# Patient Record
Sex: Female | Born: 1995 | Race: Black or African American | Hispanic: No | Marital: Single | Smoking: Never smoker
Health system: Southern US, Community
[De-identification: ages and names within clinical notes are randomized; demographics above are authoritative.]

## PROBLEM LIST (undated history)

## (undated) DIAGNOSIS — F419 Anxiety disorder, unspecified: Secondary | ICD-10-CM

## (undated) DIAGNOSIS — M1990 Unspecified osteoarthritis, unspecified site: Secondary | ICD-10-CM

## (undated) DIAGNOSIS — D649 Anemia, unspecified: Secondary | ICD-10-CM

## (undated) DIAGNOSIS — J45909 Unspecified asthma, uncomplicated: Secondary | ICD-10-CM

## (undated) DIAGNOSIS — F32A Depression, unspecified: Secondary | ICD-10-CM

## (undated) HISTORY — PX: ELBOW SURGERY: SHX618

## (undated) HISTORY — DX: Anxiety disorder, unspecified: F41.9

## (undated) HISTORY — DX: Unspecified osteoarthritis, unspecified site: M19.90

## (undated) HISTORY — DX: Depression, unspecified: F32.A

## (undated) HISTORY — DX: Anemia, unspecified: D64.9

## (undated) HISTORY — PX: SHOULDER SURGERY: SHX246

---
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
## 2014-04-09 ENCOUNTER — Encounter (HOSPITAL_COMMUNITY): Payer: Self-pay | Admitting: Nurse Practitioner

## 2014-04-09 ENCOUNTER — Emergency Department (HOSPITAL_COMMUNITY)
Admission: EM | Admit: 2014-04-09 | Discharge: 2014-04-10 | Disposition: A | Discharge status: Home / Self Care | Payer: Managed Care, Other (non HMO) | Attending: Emergency Medicine | Admitting: Emergency Medicine

## 2014-04-09 DIAGNOSIS — S0990XA Unspecified injury of head, initial encounter: Secondary | ICD-10-CM | POA: Diagnosis not present

## 2014-04-09 DIAGNOSIS — Y9389 Activity, other specified: Secondary | ICD-10-CM | POA: Insufficient documentation

## 2014-04-09 DIAGNOSIS — J45909 Unspecified asthma, uncomplicated: Secondary | ICD-10-CM | POA: Diagnosis not present

## 2014-04-09 DIAGNOSIS — Y92410 Unspecified street and highway as the place of occurrence of the external cause: Secondary | ICD-10-CM | POA: Diagnosis not present

## 2014-04-09 DIAGNOSIS — Z9889 Other specified postprocedural states: Secondary | ICD-10-CM | POA: Diagnosis not present

## 2014-04-09 DIAGNOSIS — S299XXA Unspecified injury of thorax, initial encounter: Secondary | ICD-10-CM | POA: Diagnosis not present

## 2014-04-09 DIAGNOSIS — S4992XA Unspecified injury of left shoulder and upper arm, initial encounter: Secondary | ICD-10-CM | POA: Diagnosis not present

## 2014-04-09 DIAGNOSIS — Y998 Other external cause status: Secondary | ICD-10-CM | POA: Insufficient documentation

## 2014-04-09 DIAGNOSIS — Z791 Long term (current) use of non-steroidal anti-inflammatories (NSAID): Secondary | ICD-10-CM | POA: Diagnosis not present

## 2014-04-09 HISTORY — DX: Unspecified asthma, uncomplicated: J45.909
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
## 2014-04-10 ENCOUNTER — Emergency Department (HOSPITAL_COMMUNITY): Payer: Managed Care, Other (non HMO)

## 2014-04-10 MED ORDER — NAPROXEN 500 MG PO TABS: 500.0000 mg | ORAL_TABLET | Freq: Two times a day (BID) | ORAL | Status: DC
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
## 2015-01-01 ENCOUNTER — Emergency Department (HOSPITAL_COMMUNITY)
Admission: EM | Admit: 2015-01-01 | Discharge: 2015-01-01 | Disposition: A | Discharge status: Home / Self Care | Payer: BLUE CROSS/BLUE SHIELD | Attending: Emergency Medicine | Admitting: Emergency Medicine

## 2015-01-01 ENCOUNTER — Encounter (HOSPITAL_COMMUNITY): Payer: Self-pay

## 2015-01-01 ENCOUNTER — Emergency Department (HOSPITAL_COMMUNITY): Payer: BLUE CROSS/BLUE SHIELD

## 2015-01-01 DIAGNOSIS — M25461 Effusion, right knee: Secondary | ICD-10-CM | POA: Insufficient documentation

## 2015-01-01 DIAGNOSIS — J45909 Unspecified asthma, uncomplicated: Secondary | ICD-10-CM | POA: Diagnosis not present

## 2015-01-01 DIAGNOSIS — M25561 Pain in right knee: Secondary | ICD-10-CM | POA: Diagnosis present

## 2015-01-01 DIAGNOSIS — Z791 Long term (current) use of non-steroidal anti-inflammatories (NSAID): Secondary | ICD-10-CM | POA: Insufficient documentation

## 2015-01-01 MED ORDER — OXYCODONE-ACETAMINOPHEN 5-325 MG PO TABS
2.0000 | ORAL_TABLET | Freq: Once | ORAL | Status: AC
  Administered 2015-01-01: 2 via ORAL
  Filled 2015-01-01: qty 2

## 2015-01-01 MED ORDER — HYDROCODONE-ACETAMINOPHEN 5-325 MG PO TABS: 1.0000 | ORAL_TABLET | ORAL | Status: DC | PRN

## 2015-01-01 MED ORDER — IBUPROFEN 800 MG PO TABS: 800.0000 mg | ORAL_TABLET | Freq: Three times a day (TID) | ORAL | Status: DC

## 2015-01-01 NOTE — Discharge Instructions (Signed)
Take Vicodin for severe pain only. No driving or operating heavy machinery while taking vicodin. This medication may cause drowsiness. Take ibuprofen as prescribed. Keep the knee immobilizer on unless you are non-weightbearing.  Knee Effusion The medical term for having fluid in your knee is effusion. This is often due to an internal derangement of the knee. This means something is wrong inside the knee. Some of the causes of fluid in the knee may be torn cartilage, a torn ligament, or bleeding into the joint from an injury. Your knee is likely more difficult to bend and move. This is often because there is increased pain and pressure in the joint. The time it takes for recovery from a knee effusion depends on different factors, including:   Type of injury.  Your age.  Physical and medical conditions.  Rehabilitation Strategies. How long you will be away from your normal activities will depend on what kind of knee problem you have and how much damage is present. Your knee has two types of cartilage. Articular cartilage covers the bone ends and lets your knee bend and move smoothly. Two menisci, thick pads of cartilage that form a rim inside the joint, help absorb shock and stabilize your knee. Ligaments bind the bones together and support your knee joint. Muscles move the joint, help support your knee, and take stress off the joint itself. CAUSES  Often an effusion in the knee is caused by an injury to one of the menisci. This is often a tear in the cartilage. Recovery after a meniscus injury depends on how much meniscus is damaged and whether you have damaged other knee tissue. Small tears may heal on their own with conservative treatment. Conservative means rest, limited weight bearing activity and muscle strengthening exercises. Your recovery may take up to 6 weeks.  TREATMENT  Larger tears may require surgery. Meniscus injuries may be treated during arthroscopy. Arthroscopy is a procedure in  which your surgeon uses a small telescope like instrument to look in your knee. Your caregiver can make a more accurate diagnosis (learning what is wrong) by performing an arthroscopic procedure. If your injury is on the inner margin of the meniscus, your surgeon may trim the meniscus back to a smooth rim. In other cases your surgeon will try to repair a damaged meniscus with stitches (sutures). This may make rehabilitation take longer, but may provide better long term result by helping your knee keep its shock absorption capabilities. Ligaments which are completely torn usually require surgery for repair. HOME CARE INSTRUCTIONS  Use crutches as instructed.  If a brace is applied, use as directed.  Once you are home, an ice pack applied to your swollen knee may help with discomfort and help decrease swelling.  Keep your knee raised (elevated) when you are not up and around or on crutches.  Only take over-the-counter or prescription medicines for pain, discomfort, or fever as directed by your caregiver.  Your caregivers will help with instructions for rehabilitation of your knee. This often includes strengthening exercises.  You may resume a normal diet and activities as directed. SEEK MEDICAL CARE IF:   There is increased swelling in your knee.  You notice redness, swelling, or increasing pain in your knee.  An unexplained oral temperature above 102 F (38.9 C) develops. SEEK IMMEDIATE MEDICAL CARE IF:   You develop a rash.  You have difficulty breathing.  You have any allergic reactions from medications you may have been given.  There is severe pain  with any motion of the knee. MAKE SURE YOU:   Understand these instructions.  Will watch your condition.  Will get help right away if you are not doing well or get worse. Document Released: 07/24/2003 Document Revised: 07/26/2011 Document Reviewed: 09/27/2007 [facility] Patient Information 2015 [place], [place]. This information  is not intended to replace advice given to you by your health care provider. Make sure you discuss any questions you have with your health care provider.  Patellar Dislocation A patellar dislocation occurs when your kneecap (patella) slips out of its normal position in a groove in front of the lower end of your thighbone (femur). This groove is called the patellofemoral groove.  CAUSES The kneecap is normally positioned over the front of the knee joint at the base of the thighbone. A kneecap can be dislocated when:  The kneecap is out of place (patellar tracking disorder), and force is applied.  The foot is firmly planted pointing outward, and the knee bends with the thigh turned inward. This kind of injury is common during many sports activities.  The inner edge of the kneecap is hit, pushing it toward the outer side of the leg. SIGNS AND SYMPTOMS  Severe pain.  A misshapen knee that looks like a bone is out of position.  A popping sensation, followed by a feeling that something is out of place.  Inability to bend or straighten the knee.  Knee swelling.  Cool, pale skin or numbness and tingling in or below the affected knee. DIAGNOSIS  Your health care provider will physically examine the injured area. An X-ray exam may be done to make sure a bone fracture has not occurred. In some cases, your health care provider may look inside your knee joint with an instrument much like a pencil-sized telescope (arthroscope). This may be done to make sure you have no loose cartilage in your joint. Loose cartilage is not visible on an X-ray image. TREATMENT  In many instances, the patella can be guided back into position without much difficulty. It often goes back into position by straightening the leg. Often, nothing more may be needed other than a brief period of immobilization followed by the exercises your health care provider recommends. If patellar dislocation starts to become frequent after the  first incident, surgery may be needed to prevent your patella from slipping out of place. HOME CARE INSTRUCTIONS   Only take over-the-counter or prescription medicines for pain, discomfort, or fever as directed by your health care provider.  Use a knee brace if directed to do so by your health care provider.  Use crutches as instructed.  Apply ice to the injured knee:  Put ice in a plastic bag.  Place a towel between your skin and the bag.  Leave the ice on for 20 minutes, 2-3 times a day.  Follow your health care provider's instructions for doing any recommended range-of-motion exercises or other exercises. SEEK IMMEDIATE MEDICAL CARE IF:  You have increased pain or swelling in the knee that is not relieved with medicine.  You have increasing inflammation in the knee.  You have locking or catching of your knee. MAKE SURE YOU:  Understand these instructions.  Will watch your condition.  Will get help right away if you are not doing well or get worse. Document Released: 01/26/2001 Document Revised: 02/21/2013 Document Reviewed: 12/13/2012 [facility] Patient Information 2015 [place], [place]. This information is not intended to replace advice given to you by your health care provider. Make sure you discuss  any questions you have with your health care provider. ° °
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
## 2015-01-30 HISTORY — PX: KNEE SURGERY: SHX244
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
## 2017-09-18 DIAGNOSIS — B977 Papillomavirus as the cause of diseases classified elsewhere: Secondary | ICD-10-CM | POA: Insufficient documentation

## 2019-08-01 ENCOUNTER — Ambulatory Visit: Payer: Self-pay | Admitting: Allergy and Immunology

## 2019-09-14 ENCOUNTER — Ambulatory Visit: Payer: Managed Care, Other (non HMO) | Attending: Internal Medicine

## 2019-09-14 DIAGNOSIS — Z23 Encounter for immunization: Secondary | ICD-10-CM

## 2019-09-14 NOTE — Progress Notes (Signed)
   Covid-19 Vaccination Clinic  Name:  Sandy Ryan    MRN: [id number] DOB: 11-02-1995  09/14/2019  Sandy Ryan was observed post Covid-19 immunization for 15 minutes without incident. She was provided with Vaccine Information Sheet and instruction to access the V-Safe system.   Sandy Ryan was instructed to call 911 with any severe reactions post vaccine: Marland Kitchen Difficulty breathing  . Swelling of face and throat  . A fast heartbeat  . A bad rash all over body  . Dizziness and weakness   Immunizations Administered    Name Date Dose VIS Date Route   Pfizer COVID-19 Vaccine 09/14/2019  4:17 PM 0.3 mL 07/11/2018 Intramuscular   Manufacturer: [employer], [employer]   Lot: [id number]   NDC: [address]-0

## 2019-10-08 ENCOUNTER — Ambulatory Visit: Payer: Managed Care, Other (non HMO) | Attending: Internal Medicine

## 2019-10-08 DIAGNOSIS — Z23 Encounter for immunization: Secondary | ICD-10-CM

## 2019-10-08 NOTE — Progress Notes (Signed)
   Covid-19 Vaccination Clinic  Name:  Sandy Ryan    MRN: [id number] DOB: 01/12/1996  10/08/2019  Sandy Ryan was observed post Covid-19 immunization for 15 minutes without incident. She was provided with Vaccine Information Sheet and instruction to access the V-Safe system.   Sandy Ryan was instructed to call 911 with any severe reactions post vaccine: Marland Kitchen Difficulty breathing  . Swelling of face and throat  . A fast heartbeat  . A bad rash all over body  . Dizziness and weakness   Immunizations Administered    Name Date Dose VIS Date Route   Pfizer COVID-19 Vaccine 10/08/2019  4:02 PM 0.3 mL 07/11/2018 Intramuscular   Manufacturer: [employer], [employer]   Lot: [id number]   NDC: [address]-1

## 2019-12-26 ENCOUNTER — Encounter (HOSPITAL_COMMUNITY): Payer: Self-pay | Admitting: Emergency Medicine

## 2019-12-26 ENCOUNTER — Emergency Department (HOSPITAL_COMMUNITY)
Admission: EM | Admit: 2019-12-26 | Discharge: 2019-12-26 | Disposition: A | Discharge status: Home / Self Care | Payer: Managed Care, Other (non HMO) | Attending: Emergency Medicine | Admitting: Emergency Medicine

## 2019-12-26 DIAGNOSIS — R040 Epistaxis: Secondary | ICD-10-CM | POA: Insufficient documentation

## 2019-12-26 DIAGNOSIS — Z5321 Procedure and treatment not carried out due to patient leaving prior to being seen by health care provider: Secondary | ICD-10-CM | POA: Insufficient documentation

## 2019-12-26 DIAGNOSIS — R05 Cough: Secondary | ICD-10-CM | POA: Diagnosis not present

## 2019-12-26 NOTE — ED Triage Notes (Signed)
Pt reports that she had negative covid test yesterday. Reports today had cough and took Dayquil for it thre it up then her nose started bleeding. Bleeding stopped at this time.

## 2020-01-03 ENCOUNTER — Ambulatory Visit: Payer: Managed Care, Other (non HMO) | Admitting: Nurse Practitioner

## 2020-01-14 ENCOUNTER — Other Ambulatory Visit: Payer: Self-pay

## 2020-01-14 ENCOUNTER — Encounter: Payer: Self-pay | Admitting: Nurse Practitioner

## 2020-01-14 ENCOUNTER — Ambulatory Visit (INDEPENDENT_AMBULATORY_CARE_PROVIDER_SITE_OTHER): Payer: Managed Care, Other (non HMO) | Admitting: Nurse Practitioner

## 2020-01-14 VITALS — BP 118/78 | HR 82 | Temp 97.4°F | Resp 18 | Ht 67.0 in | Wt 243.8 lb

## 2020-01-14 DIAGNOSIS — Z13220 Encounter for screening for lipoid disorders: Secondary | ICD-10-CM

## 2020-01-14 DIAGNOSIS — R87612 Low grade squamous intraepithelial lesion on cytologic smear of cervix (LGSIL): Secondary | ICD-10-CM

## 2020-01-14 DIAGNOSIS — Z136 Encounter for screening for cardiovascular disorders: Secondary | ICD-10-CM

## 2020-01-14 DIAGNOSIS — Z124 Encounter for screening for malignant neoplasm of cervix: Secondary | ICD-10-CM

## 2020-01-14 DIAGNOSIS — D500 Iron deficiency anemia secondary to blood loss (chronic): Secondary | ICD-10-CM | POA: Diagnosis not present

## 2020-01-14 DIAGNOSIS — Z0000 Encounter for general adult medical examination without abnormal findings: Secondary | ICD-10-CM

## 2020-01-14 DIAGNOSIS — M222X9 Patellofemoral disorders, unspecified knee: Secondary | ICD-10-CM | POA: Insufficient documentation

## 2020-01-14 NOTE — Assessment & Plan Note (Signed)
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 Repeat pap 08/23/2011 returned LGSIL, HPV effects present. Referred to gyn and seen by Dr. Vilma Prader 09/07/2011 with colposcopy reported as "no lesions seen to bx"  Entered referral to GYN today

## 2020-01-14 NOTE — Progress Notes (Signed)
Subjective:    Patient ID: Sandy Ryan, female    DOB: July 04, 1995, 24 y.o.   MRN: [id number]  Patient presents today for CPE and establish care (new patient)  HPI  Sexual History (orientation,birth control, marital status, STD):Bisexual, not sexually active at this time, last PAP 2019, will prefer pelvic and breast exam done by GYN, request for GYN referral, has regular menstrual cycles, denies need for STD screen  Depression/Suicide: Depression screen [facility] 2/9 01/14/2020  Decreased Interest 0  Down, Depressed, Hopeless 0  PHQ - 2 Score 0   Vision:up to date, use of corrective lens  Dental:up to date  Immunizations: (TDAP, Hep C screen, Pneumovax, Influenza, zoster)  Health Maintenance  Topic Date Due   Pap Smear  Never done   Pap Smear  Never done   Flu Shot  12/16/2019   Tetanus Vaccine  03/13/2028   COVID-19 Vaccine  Completed    Hepatitis C: One time screening is recommended by Center for Disease Control  (CDC) for  adults born from [birth year] through 1965.   Completed   HIV Screening  Completed   Diet:heart health Exercise: daily with trainer.  Weight:  Wt Readings from Last 3 Encounters:  01/14/20 243 lb 12.8 oz (110.6 kg)  01/01/15 162 lb (73.5 kg) (88 %, Z= 1.18)*  04/09/14 159 lb (72.1 kg) (88 %, Z= 1.15)*   * Growth percentiles are based on CDC (Girls, 2-20 Years) data.   Fall Risk: Fall Risk  01/14/2020  Falls in the past year? 0  Number falls in past yr: 0  Injury with Fall? 0  Risk for fall due to : No Fall Risks   Medications and allergies reviewed with patient and updated if appropriate.  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 (Patient taking differently: Take 800 mg by mouth every 6 (six) hours as needed. ) 21 tablet 0   No current facility-administered medications on file prior to visit.    Past Medical History:  Diagnosis Date   Asthma     Past Surgical History:  Procedure Laterality Date   ELBOW SURGERY     KNEE SURGERY Right 01/30/2015   torn meniscus and ACL   SHOULDER SURGERY     thumb surgery Left 2010    Social History   Socioeconomic History   Marital status: Single    Spouse name: Not on file   Number of children: 0   Years of education: 14   Highest education level: Associate degree: academic program  Occupational History   Not on file  Tobacco Use   Smoking status: Never Smoker   Smokeless tobacco: Never Used  Building services engineer Use: Never used  Substance and Sexual Activity   Alcohol use: Yes    Comment: socially   Drug use: No   Sexual activity: Not Currently    Birth control/protection: Abstinence    Comment: Bisexual  Other Topics Concern   Not on file  Social History Narrative   Not on file   Social Determinants of Health   Financial Resource Strain:  Difficulty of Paying Living Expenses: Not on file  Food Insecurity:    Worried About Running Out of Food in the Last Year: Not on file   Ran Out of Food in the Last Year: Not on file  Transportation Needs:    Lack of Transportation (Medical): Not on file   Lack of Transportation (Non-Medical): Not on file  Physical Activity:    Days of Exercise per Week: Not on file   Minutes of Exercise per Session: Not on file  Stress:    Feeling of Stress : Not on file  Social Connections:    Frequency of Communication with Friends and Family: Not on file   Frequency of Social Gatherings with Friends and Family: Not on file   Attends Religious Services: Not on file   Active Member of Clubs or  Organizations: Not on file   Attends Banker Meetings: Not on file   Marital Status: Not on file    Family History  Problem Relation Age of Onset   Hypertension Mother         Review of Systems  Constitutional: Negative for fever, malaise/fatigue and weight loss.  HENT: Negative for congestion and sore throat.   Eyes:       Negative for visual changes  Respiratory: Negative for cough and shortness of breath.   Cardiovascular: Negative for chest pain, palpitations and leg swelling.  Gastrointestinal: Negative for blood in stool, constipation, diarrhea and heartburn.  Genitourinary: Negative for dysuria, frequency and urgency.  Musculoskeletal: Negative for falls, joint pain and myalgias.  Skin: Negative for rash.  Neurological: Negative for dizziness, sensory change and headaches.  Endo/Heme/Allergies: Does not bruise/bleed easily.  Psychiatric/Behavioral: Negative for depression, substance abuse and suicidal ideas. The patient is not nervous/anxious.    Objective:   Vitals:   01/14/20 1309  BP: 118/78  Pulse: 82  Resp: 18  Temp: (!) 97.4 F (36.3 C)  SpO2: 99%   Body mass index is 38.18 kg/m.  Physical Examination:  Physical Exam Vitals reviewed.  Constitutional:      General: She is not in acute distress.    Appearance: She is well-developed. She is obese.  HENT:     Right Ear: Tympanic membrane, ear canal and external ear normal.     Left Ear: Tympanic membrane, ear canal and external ear normal.  Eyes:     Extraocular Movements: Extraocular movements intact.     Conjunctiva/sclera: Conjunctivae normal.  Cardiovascular:     Rate and Rhythm: Normal rate and regular rhythm.     Pulses: Normal pulses.     Heart sounds: Normal heart sounds.  Pulmonary:     Effort: Pulmonary effort is normal. No respiratory distress.     Breath sounds: Normal breath sounds.  Chest:     Chest wall: No tenderness.  Abdominal:     General: Bowel sounds are  normal.     Palpations: Abdomen is soft.  Genitourinary:    Comments: Deferred breast and pelvic exam to GYN per patient Musculoskeletal:        General: Normal range of motion.     Cervical back: Normal range of motion and neck supple.  Lymphadenopathy:     Cervical: No cervical adenopathy.  Skin:    General: Skin is warm and dry.  Neurological:     Mental Status: She is alert and oriented to person, place, and time.     Deep Tendon Reflexes: Reflexes are normal and symmetric.  Psychiatric:  Mood and Affect: Mood normal.        Behavior: Behavior normal.        Thought Content: Thought content normal.    ASSESSMENT and PLAN: This visit occurred during the SARS-CoV-2 public health emergency.  Safety protocols were in place, including screening questions prior to the visit, additional usage of staff PPE, and extensive cleaning of exam room while observing appropriate contact time as indicated for disinfecting solutions.   Drew was seen today for annual exam.  Diagnoses and all orders for this visit:  Preventative health care -     CBC with Differential/Platelet; Future -     Comprehensive metabolic panel; Future -     Lipid panel; Future -     TSH; Future  Pap smear for cervical cancer screening -     Ambulatory referral to Gynecology  Encounter for lipid screening for cardiovascular disease -     Lipid panel; Future  Iron deficiency anemia due to chronic blood loss -     Iron and TIBC; Future  Low grade squamous intraepithelial lesion on cytologic smear of cervix (LGSIL) -     Ambulatory referral to Gynecology        Problem List Items Addressed This Visit      Other   Abnormal Pap smear of cervix    Per record from previous pcp with [facility]: "Pap smear here baseline 07/30/2011 returned LGSIL, mild dysplasia, HPV positive. Repeat pap 08/23/2011 returned LGSIL, HPV effects present. Referred to gyn and seen by Dr. Vilma Prader 09/07/2011 with colposcopy reported  as "no lesions seen to bx"  Entered referral to GYN today       Relevant Orders   Ambulatory referral to Gynecology   Iron deficiency anemia due to chronic blood loss   Relevant Orders   Iron and TIBC    Other Visit Diagnoses    Preventative health care    -  Primary   Relevant Orders   CBC with Differential/Platelet   Comprehensive metabolic panel   Lipid panel   TSH   Pap smear for cervical cancer screening       Relevant Orders   Ambulatory referral to Gynecology   Encounter for lipid screening for cardiovascular disease       Relevant Orders   Lipid panel      Follow up: Return if symptoms worsen or fail to improve.  Alysia Penna, NP

## 2020-01-14 NOTE — Patient Instructions (Signed)
You will be contacted to schedule an appt with GYN  Schedule lab appt (need to be fasting at least 6-8hrs prior to blood draw)  Thank you for choosing [facility] for you health needs   Health Maintenance, Female Adopting a healthy lifestyle and getting preventive care are important in promoting health and wellness. Ask your health care provider about:  The right schedule for you to have regular tests and exams.  Things you can do on your own to prevent diseases and keep yourself healthy. What should I know about diet, weight, and exercise? Eat a healthy diet   Eat a diet that includes plenty of vegetables, fruits, low-fat dairy products, and lean protein.  Do not eat a lot of foods that are high in solid fats, added sugars, or sodium. Maintain a healthy weight Body mass index (BMI) is used to identify weight problems. It estimates body fat based on height and weight. Your health care provider can help determine your BMI and help you achieve or maintain a healthy weight. Get regular exercise Get regular exercise. This is one of the most important things you can do for your health. Most adults should:  Exercise for at least 150 minutes each week. The exercise should increase your heart rate and make you sweat (moderate-intensity exercise).  Do strengthening exercises at least twice a week. This is in addition to the moderate-intensity exercise.  Spend less time sitting. Even light physical activity can be beneficial. Watch cholesterol and blood lipids Have your blood tested for lipids and cholesterol at 25 years of age, then have this test every 5 years. Have your cholesterol levels checked more often if:  Your lipid or cholesterol levels are high.  You are older than 25 years of age.  You are at high risk for heart disease. What should I know about cancer screening? Depending on your health history and family history, you may need to have cancer screening at various  ages. This may include screening for:  Breast cancer.  Cervical cancer.  Colorectal cancer.  Skin cancer.  Lung cancer. What should I know about heart disease, diabetes, and high blood pressure? Blood pressure and heart disease  High blood pressure causes heart disease and increases the risk of stroke. This is more likely to develop in people who have high blood pressure readings, are of African descent, or are overweight.  Have your blood pressure checked: ? Every 3-5 years if you are 37-4 years of age. ? Every year if you are 94 years old or older. Diabetes Have regular diabetes screenings. This checks your fasting blood sugar level. Have the screening done:  Once every three years after age 57 if you are at a normal weight and have a low risk for diabetes.  More often and at a younger age if you are overweight or have a high risk for diabetes. What should I know about preventing infection? Hepatitis B If you have a higher risk for hepatitis B, you should be screened for this virus. Talk with your health care provider to find out if you are at risk for hepatitis B infection. Hepatitis C Testing is recommended for:  Everyone born from [birth year] through 1966.  Anyone with known risk factors for hepatitis C. Sexually transmitted infections (STIs)  Get screened for STIs, including gonorrhea and chlamydia, if: ? You are sexually active and are younger than 25 years of age. ? You are older than 25 years of age and your health care provider  tells you that you are at risk for this type of infection. ? Your sexual activity has changed since you were last screened, and you are at increased risk for chlamydia or gonorrhea. Ask your health care provider if you are at risk.  Ask your health care provider about whether you are at high risk for HIV. Your health care provider may recommend a prescription medicine to help prevent HIV infection. If you choose to take medicine to prevent HIV, you  should first get tested for HIV. You should then be tested every 3 months for as long as you are taking the medicine. Pregnancy  If you are about to stop having your period (premenopausal) and you may become pregnant, seek counseling before you get pregnant.  Take 400 to 800 micrograms (mcg) of folic acid every day if you become pregnant.  Ask for birth control (contraception) if you want to prevent pregnancy. Osteoporosis and menopause Osteoporosis is a disease in which the bones lose minerals and strength with aging. This can result in bone fractures. If you are 41 years old or older, or if you are at risk for osteoporosis and fractures, ask your health care provider if you should:  Be screened for bone loss.  Take a calcium or vitamin D supplement to lower your risk of fractures.  Be given hormone replacement therapy (HRT) to treat symptoms of menopause. Follow these instructions at home: Lifestyle  Do not use any products that contain nicotine or tobacco, such as cigarettes, e-cigarettes, and chewing tobacco. If you need help quitting, ask your health care provider.  Do not use street drugs.  Do not share needles.  Ask your health care provider for help if you need support or information about quitting drugs. Alcohol use  Do not drink alcohol if: ? Your health care provider tells you not to drink. ? You are pregnant, may be pregnant, or are planning to become pregnant.  If you drink alcohol: ? Limit how much you use to 0-1 drink a day. ? Limit intake if you are breastfeeding.  Be aware of how much alcohol is in your drink. In the [place] one drink equals one 12 oz bottle of beer (355 mL), one 5 oz glass of wine (148 mL), or one 1 oz glass of hard liquor (44 mL). General instructions  Schedule regular health, dental, and eye exams.  Stay current with your vaccines.  Tell your health care provider if: ? You often feel depressed. ? You have ever been abused or do not feel  safe at home. Summary  Adopting a healthy lifestyle and getting preventive care are important in promoting health and wellness.  Follow your health care provider's instructions about healthy diet, exercising, and getting tested or screened for diseases.  Follow your health care provider's instructions on monitoring your cholesterol and blood pressure. This information is not intended to replace advice given to you by your health care provider. Make sure you discuss any questions you have with your health care provider. Document Revised: 04/26/2018 Document Reviewed: 04/26/2018 Elsevier Patient Education  2020 [employer].

## 2020-02-06 ENCOUNTER — Other Ambulatory Visit: Payer: Managed Care, Other (non HMO)

## 2020-02-18 ENCOUNTER — Other Ambulatory Visit (INDEPENDENT_AMBULATORY_CARE_PROVIDER_SITE_OTHER): Payer: Managed Care, Other (non HMO)

## 2020-02-18 ENCOUNTER — Other Ambulatory Visit: Payer: Managed Care, Other (non HMO)

## 2020-02-18 DIAGNOSIS — Z13220 Encounter for screening for lipoid disorders: Secondary | ICD-10-CM

## 2020-02-18 DIAGNOSIS — Z136 Encounter for screening for cardiovascular disorders: Secondary | ICD-10-CM

## 2020-02-18 DIAGNOSIS — Z0000 Encounter for general adult medical examination without abnormal findings: Secondary | ICD-10-CM | POA: Diagnosis not present

## 2020-02-18 LAB — TSH: TSH: 1.57 u[IU]/mL (ref 0.35–4.50)

## 2020-02-18 LAB — CBC WITH DIFFERENTIAL/PLATELET
Basophils Absolute: 0.1 10*3/uL (ref 0.0–0.1)
Basophils Relative: 0.9 % (ref 0.0–3.0)
Eosinophils Absolute: 0.6 10*3/uL (ref 0.0–0.7)
Eosinophils Relative: 8.4 % — ABNORMAL HIGH (ref 0.0–5.0)
HCT: 34.5 % — ABNORMAL LOW (ref 36.0–46.0)
Hemoglobin: 11.1 g/dL — ABNORMAL LOW (ref 12.0–15.0)
Lymphocytes Relative: 25.4 % (ref 12.0–46.0)
Lymphs Abs: 1.9 10*3/uL (ref 0.7–4.0)
MCHC: 32.1 g/dL (ref 30.0–36.0)
MCV: 73.5 fl — ABNORMAL LOW (ref 78.0–100.0)
Monocytes Absolute: 0.7 10*3/uL (ref 0.1–1.0)
Monocytes Relative: 8.8 % (ref 3.0–12.0)
Neutro Abs: 4.3 10*3/uL (ref 1.4–7.7)
Neutrophils Relative %: 56.5 % (ref 43.0–77.0)
Platelets: 362 10*3/uL (ref 150.0–400.0)
RBC: 4.7 Mil/uL (ref 3.87–5.11)
RDW: 18.8 % — ABNORMAL HIGH (ref 11.5–15.5)
WBC: 7.6 10*3/uL (ref 4.0–10.5)

## 2020-02-18 LAB — LIPID PANEL
Cholesterol: 139 mg/dL (ref 0–200)
HDL: 45 mg/dL (ref >39.00)
LDL Cholesterol: 80 mg/dL (ref 0–99)
NonHDL: 93.8
Total CHOL/HDL Ratio: 3
Triglycerides: 68 mg/dL (ref 0.0–149.0)
VLDL: 13.6 mg/dL (ref 0.0–40.0)

## 2020-02-18 LAB — COMPREHENSIVE METABOLIC PANEL
ALT: 11 U/L (ref 0–35)
AST: 13 U/L (ref 0–37)
Albumin: 3.8 g/dL (ref 3.5–5.2)
Alkaline Phosphatase: 71 U/L (ref 39–117)
BUN: 6 mg/dL (ref 6–23)
CO2: 24 mEq/L (ref 19–32)
Calcium: 8.8 mg/dL (ref 8.4–10.5)
Chloride: 105 mEq/L (ref 96–112)
Creatinine, Ser: 0.84 mg/dL (ref 0.40–1.20)
GFR: 99.93 mL/min (ref >60.00)
Glucose, Bld: 87 mg/dL (ref 70–99)
Potassium: 4.1 mEq/L (ref 3.5–5.1)
Sodium: 136 mEq/L (ref 135–145)
Total Bilirubin: 0.3 mg/dL (ref 0.2–1.2)
Total Protein: 7.7 g/dL (ref 6.0–8.3)

## 2020-02-19 LAB — IRON, TOTAL/TOTAL IRON BINDING CAP
%SAT: 8 % (calc) — ABNORMAL LOW (ref 16–45)
Iron: 30 ug/dL — ABNORMAL LOW (ref 40–190)
TIBC: 377 mcg/dL (calc) (ref 250–450)

## 2020-02-22 MED ORDER — FERROUS SULFATE 325 (65 FE) MG PO TBEC: 325.0000 mg | DELAYED_RELEASE_TABLET | Freq: Three times a day (TID) | ORAL | 3 refills | Status: DC

## 2020-02-22 NOTE — Addendum Note (Signed)
Addended by: Alysia Penna L on: 02/22/2020 03:56 PM   Modules accepted: Orders

## 2020-05-24 ENCOUNTER — Other Ambulatory Visit: Payer: Managed Care, Other (non HMO)

## 2020-05-24 ENCOUNTER — Other Ambulatory Visit: Payer: Self-pay

## 2020-05-24 DIAGNOSIS — Z20822 Contact with and (suspected) exposure to covid-19: Secondary | ICD-10-CM

## 2020-05-28 LAB — NOVEL CORONAVIRUS, NAA: SARS-CoV-2, NAA: NOT DETECTED

## 2020-10-09 ENCOUNTER — Other Ambulatory Visit: Payer: Managed Care, Other (non HMO)

## 2020-10-14 ENCOUNTER — Ambulatory Visit: Payer: Self-pay | Attending: Critical Care Medicine

## 2020-10-14 DIAGNOSIS — Z20822 Contact with and (suspected) exposure to covid-19: Secondary | ICD-10-CM

## 2020-10-15 LAB — NOVEL CORONAVIRUS, NAA: SARS-CoV-2, NAA: NOT DETECTED

## 2020-10-15 LAB — SARS-COV-2, NAA 2 DAY TAT

## 2021-01-05 ENCOUNTER — Encounter: Payer: Self-pay | Admitting: Physician Assistant

## 2021-01-05 ENCOUNTER — Other Ambulatory Visit: Payer: Self-pay

## 2021-01-05 ENCOUNTER — Ambulatory Visit: Payer: No Typology Code available for payment source | Admitting: Physician Assistant

## 2021-01-05 VITALS — BP 120/80 | HR 91 | Temp 97.7°F | Ht 67.0 in | Wt 253.4 lb

## 2021-01-05 DIAGNOSIS — L732 Hidradenitis suppurativa: Secondary | ICD-10-CM

## 2021-01-05 MED ORDER — DOXYCYCLINE HYCLATE 100 MG PO TABS: 100.0000 mg | ORAL_TABLET | Freq: Two times a day (BID) | ORAL | 0 refills | Status: DC

## 2021-01-05 MED ORDER — MUPIROCIN 2 % EX OINT: 1.0000 "application " | TOPICAL_OINTMENT | Freq: Two times a day (BID) | CUTANEOUS | 0 refills | Status: DC

## 2021-01-05 NOTE — Patient Instructions (Signed)
It was great to see you!  Start oral doxycycline antibiotic  Use topical antibiotic as needed for future areas  Referral to dermatology placed today  If any worsening or new symptoms, I need to know ASAP

## 2021-01-05 NOTE — Progress Notes (Signed)
Sandy Ryan is a 25 y.o. female here for a new problem.  I acted as a Neurosurgeon for [employer], PA-C Corky Mull, LPN   History of Present Illness:   Chief Complaint  Patient presents with   Recurrent Skin Infections    HPI  Patient is seeing me for an acute issue, current PCP is Nche, Bonna Gains, NP.   Boil Pt c/o boil in her left axilla and left groin x 1 month. She reports recent history of boils x 1 - 1.5 years.  Both current areas have opened and drained but the axilla one is still draining pus-like fluid. The axilla lesion is painful and has had some irritation around it due to using bandaids with adhesive to keep area covered.  Denies: fevers, chills, nausea, vomiting, concern for pregnancy  She does have hx of eczema and significant skin sensitivity to adhesives.     Past Medical History:  Diagnosis Date   Asthma      Social History   Tobacco Use   Smoking status: Never   Smokeless tobacco: Never  Vaping Use   Vaping Use: Never used  Substance Use Topics   Alcohol use: Yes    Comment: socially   Drug use: No    Past Surgical History:  Procedure Laterality Date   ELBOW SURGERY     KNEE SURGERY Right 01/30/2015   torn meniscus and ACL   SHOULDER SURGERY     thumb surgery Left 2010    Family History  Problem Relation Age of Onset   Hypertension Mother     Allergies  Allergen Reactions   Tape     adhesive   Tapentadol Hives    adhesive adhesive    Wound Dressing Adhesive     adhesive   Other Rash    Current Medications:   Current Outpatient Medications:    doxycycline (VIBRA-TABS) 100 MG tablet, Take 1 tablet (100 mg total) by mouth 2 (two) times daily., Disp: 20 tablet, Rfl: 0   ferrous sulfate 325 (65 FE) MG EC tablet, Take 1 tablet (325 mg total) by mouth 3 (three) times daily with meals., Disp: 90 tablet, Rfl: 3   ibuprofen (ADVIL,MOTRIN) 800 MG tablet, Take 1 tablet (800 mg total) by mouth 3 (three) times daily.  (Patient taking differently: Take 800 mg by mouth every 6 (six) hours as needed.), Disp: 21 tablet, Rfl: 0   mupirocin ointment (BACTROBAN) 2 %, Apply 1 application topically 2 (two) times daily., Disp: 22 g, Rfl: 0   Review of Systems:   ROS Negative unless otherwise specified per HPI.  Vitals:   Vitals:   01/05/21 0808  BP: 120/80  Pulse: 91  Temp: 97.7 F (36.5 C)  TempSrc: Temporal  SpO2: 95%  Weight: 253 lb 6.1 oz (114.9 kg)  Height: 5\' 7"  (1.702 m)     Body mass index is 39.68 kg/m.  Physical Exam:   Physical Exam Constitutional:      Appearance: Normal appearance. She is well-developed.  HENT:     Head: Normocephalic and atraumatic.  Eyes:     General: Lids are normal.     Extraocular Movements: Extraocular movements intact.     Conjunctiva/sclera: Conjunctivae normal.  Pulmonary:     Effort: Pulmonary effort is normal.  Musculoskeletal:        General: Normal range of motion.     Cervical back: Normal range of motion and neck supple.  Skin:    General: Skin is warm and  dry.     Comments: Superior L axillary region with approximately 1/2 cm sized area with superficial skin loss without active discharge, no surrounding erythema or induration  L upper inner thigh with well healing approximately 1/2 cm sized lesion without active discharge, no surrounding erythema or induration  Neurological:     Mental Status: She is alert and oriented to person, place, and time.  Psychiatric:        Attention and Perception: Attention and perception normal.        Mood and Affect: Mood normal.        Behavior: Behavior normal.        Thought Content: Thought content normal.        Judgment: Judgment normal.      Assessment and Plan:   Sandy Ryan was seen today for recurrent skin infections.  Diagnoses and all orders for this visit:  Hidradenitis suppurativa No concerns for systemic illness, she is in no acute distress at this time Given prolonged healing of axillary  region, will start oral doxycycline (she denies any concerns for pregnancy) Topical bactroban to areas now and/or future Referral to dermatology for long-term management If any new/worsening symptoms prior to dermatology, follow-up with me or PCP -     Ambulatory referral to Dermatology  Other orders -     doxycycline (VIBRA-TABS) 100 MG tablet; Take 1 tablet (100 mg total) by mouth 2 (two) times daily. -     mupirocin ointment (BACTROBAN) 2 %; Apply 1 application topically 2 (two) times daily.  CMA or LPN served as scribe during this visit. History, Physical, and Plan performed by medical provider. The above documentation has been reviewed and is accurate and complete.  Jarold Motto, PA-C

## 2021-01-09 ENCOUNTER — Telehealth: Payer: Self-pay

## 2021-01-09 NOTE — Telephone Encounter (Signed)
Pt stated that [facility] sent a referral for dermatology and she would like a new referral. She stated that she talked to the office and the first appt will be in February. She stated that she cannot wait the long and would like to be referred somewhere else. Please Advise.

## 2021-01-09 NOTE — Telephone Encounter (Signed)
Message already sent to [facility].

## 2021-03-10 ENCOUNTER — Encounter: Payer: No Typology Code available for payment source | Admitting: Nurse Practitioner

## 2021-03-18 ENCOUNTER — Telehealth: Payer: Self-pay | Admitting: Nurse Practitioner

## 2021-03-20 NOTE — Telephone Encounter (Signed)
Patient is scheduled   

## 2021-03-20 NOTE — Telephone Encounter (Signed)
Called patient to schedule TOC but patient did not want to wait for the next available with [facility] and is wondering if she can transfer to [facility].

## 2021-03-29 ENCOUNTER — Other Ambulatory Visit: Payer: Self-pay | Admitting: Physician Assistant

## 2021-03-31 ENCOUNTER — Ambulatory Visit (INDEPENDENT_AMBULATORY_CARE_PROVIDER_SITE_OTHER): Payer: BC Managed Care – PPO | Admitting: Physician Assistant

## 2021-03-31 ENCOUNTER — Other Ambulatory Visit: Payer: Self-pay

## 2021-03-31 ENCOUNTER — Other Ambulatory Visit (HOSPITAL_COMMUNITY)
Admission: RE | Admit: 2021-03-31 | Discharge: 2021-03-31 | Disposition: A | Discharge status: Home / Self Care | Payer: BC Managed Care – PPO | Source: Ambulatory Visit | Attending: Physician Assistant | Admitting: Physician Assistant

## 2021-03-31 VITALS — BP 117/79 | HR 83 | Temp 98.2°F | Ht 67.0 in | Wt 255.0 lb

## 2021-03-31 DIAGNOSIS — Z23 Encounter for immunization: Secondary | ICD-10-CM | POA: Diagnosis not present

## 2021-03-31 DIAGNOSIS — L732 Hidradenitis suppurativa: Secondary | ICD-10-CM | POA: Diagnosis not present

## 2021-03-31 DIAGNOSIS — Z124 Encounter for screening for malignant neoplasm of cervix: Secondary | ICD-10-CM

## 2021-03-31 DIAGNOSIS — Z0000 Encounter for general adult medical examination without abnormal findings: Secondary | ICD-10-CM | POA: Insufficient documentation

## 2021-03-31 MED ORDER — DOXYCYCLINE HYCLATE 100 MG PO TABS: 100.0000 mg | ORAL_TABLET | Freq: Two times a day (BID) | ORAL | 0 refills | Status: AC

## 2021-03-31 NOTE — Progress Notes (Signed)
Subjective:    Patient ID: Sandy Ryan, female    DOB: 12-21-1995, 25 y.o.   MRN: [id number]  Chief Complaint  Patient presents with   Annual Exam    HPI Patient is in today for annual CPE.  Acute concerns: Hidradenitis inner thighs - recurrent painful boils. States last course of doxycycline helped, but they came back. She would like to see dermatology as well.   Health maintenance: Lifestyle/ exercise: Could be doing better Nutrition: Working on this Mental health: No concerns Sleep: Doing well  Substance use: None Immunizations: Flu shot today  Pap: Needs this done today    Past Medical History:  Diagnosis Date   Asthma     Past Surgical History:  Procedure Laterality Date   ELBOW SURGERY     KNEE SURGERY Right 01/30/2015   torn meniscus and ACL   SHOULDER SURGERY     thumb surgery Left 2010    Family History  Problem Relation Age of Onset   Hypertension Mother     Social History   Tobacco Use   Smoking status: Never   Smokeless tobacco: Never  Vaping Use   Vaping Use: Never used  Substance Use Topics   Alcohol use: Yes    Comment: socially   Drug use: No     Allergies  Allergen Reactions   Tape     adhesive   Tapentadol Hives    adhesive adhesive    Wound Dressing Adhesive     adhesive   Other Rash    Review of Systems  Constitutional:  Negative for activity change, appetite change, fever and unexpected weight change.  HENT:  Negative for congestion.   Eyes:  Negative for visual disturbance.  Respiratory:  Negative for apnea, cough and shortness of breath.   Cardiovascular:  Negative for chest pain, palpitations and leg swelling.  Gastrointestinal:  Negative for abdominal pain, blood in stool, constipation and diarrhea.  Endocrine: Negative for polydipsia, polyphagia and polyuria.  Genitourinary:  Negative for dysuria and pelvic pain.  Musculoskeletal:  Negative for arthralgias.  Skin:  Negative for rash.       Hidradenitis  inner thighs  Neurological:  Negative for dizziness, weakness and headaches.  Hematological:  Negative for adenopathy. Does not bruise/bleed easily.  Psychiatric/Behavioral:  Negative for sleep disturbance and suicidal ideas. The patient is not nervous/anxious.        Objective:     BP 117/79   Pulse 83   Temp 98.2 F (36.8 C)   Ht 5\' 7"  (1.702 m)   Wt 255 lb (115.7 kg)   LMP 02/16/2021   SpO2 98%   BMI 39.94 kg/m   Wt Readings from Last 3 Encounters:  03/31/21 255 lb (115.7 kg)  01/05/21 253 lb 6.1 oz (114.9 kg)  01/14/20 243 lb 12.8 oz (110.6 kg)    BP Readings from Last 3 Encounters:  03/31/21 117/79  01/05/21 120/80  01/14/20 118/78     Physical Exam Vitals and nursing note reviewed. Exam conducted with a chaperone present.  Constitutional:      Appearance: Normal appearance. She is obese. She is not toxic-appearing.  HENT:     Head: Normocephalic and atraumatic.     Right Ear: Tympanic membrane, ear canal and external ear normal.     Left Ear: Tympanic membrane, ear canal and external ear normal.     Nose: Nose normal.     Mouth/Throat:     Mouth: Mucous membranes are moist.  Eyes:  Extraocular Movements: Extraocular movements intact.     Conjunctiva/sclera: Conjunctivae normal.     Pupils: Pupils are equal, round, and reactive to light.  Cardiovascular:     Rate and Rhythm: Normal rate and regular rhythm.     Pulses: Normal pulses.     Heart sounds: Normal heart sounds.  Pulmonary:     Effort: Pulmonary effort is normal.     Breath sounds: Normal breath sounds.  Abdominal:     General: Abdomen is flat. Bowel sounds are normal.     Palpations: Abdomen is soft.  Genitourinary:    General: Normal vulva.     Vagina: Normal.     Cervix: Normal.     Uterus: Normal.      Adnexa: Right adnexa normal and left adnexa normal.  Musculoskeletal:        General: Normal range of motion.     Cervical back: Normal range of motion and neck supple.  Skin:     General: Skin is warm and dry.     Comments: Inner thighs hidradenitis present  Neurological:     General: No focal deficit present.     Mental Status: She is alert and oriented to person, place, and time.  Psychiatric:        Mood and Affect: Mood normal.        Behavior: Behavior normal.        Thought Content: Thought content normal.        Judgment: Judgment normal.       Assessment & Plan:   Problem List Items Addressed This Visit   None Visit Diagnoses     Encounter for annual physical exam    -  Primary   Relevant Orders   Cytology - PAP   Pap smear for cervical cancer screening       Relevant Orders   Cytology - PAP   Hidradenitis suppurativa       Need for immunization against influenza       Relevant Orders   Flu Vaccine QUAD 90mo+IM (Fluarix, Fluzone & Alfiuria Quad PF) (Completed)        Meds ordered this encounter  Medications   doxycycline (VIBRA-TABS) 100 MG tablet    Sig: Take 1 tablet (100 mg total) by mouth 2 (two) times daily for 14 days.    Dispense:  28 tablet    Refill:  0   1. Encounter for annual physical exam 2. Pap smear for cervical cancer screening Age-appropriate screening and counseling performed today. Declined labs, but pap smear performed. Preventive measures discussed and printed in AVS for patient. She is going to work on healthy lifestyle changes. Vision / dental annually.  3. Hidradenitis suppurativa Recurrent, treat with doxycycline today, cautioned on potential side effects. Referral to derm.  4. Need for immunization against influenza Updated vaccination today    Cadence Minton M Natale Thoma, PA-C

## 2021-03-31 NOTE — Patient Instructions (Addendum)
Good to meet you today!  Flu shot today Please call [facility] on [address] Doxycycline as directed for hidradenitis   Call sooner if any concerns.

## 2021-04-07 LAB — CYTOLOGY - PAP
Chlamydia: NEGATIVE
Comment: NEGATIVE
Comment: NEGATIVE
Comment: NORMAL
Diagnosis: NEGATIVE
Neisseria Gonorrhea: NEGATIVE
Trichomonas: NEGATIVE

## 2021-07-14 ENCOUNTER — Encounter: Payer: Self-pay | Admitting: Physician Assistant

## 2021-07-17 ENCOUNTER — Ambulatory Visit: Payer: BC Managed Care – PPO | Admitting: Family Medicine

## 2021-07-17 ENCOUNTER — Ambulatory Visit: Payer: BC Managed Care – PPO | Admitting: Family

## 2021-07-22 ENCOUNTER — Ambulatory Visit: Payer: BC Managed Care – PPO | Admitting: Physician Assistant

## 2021-08-03 DIAGNOSIS — L910 Hypertrophic scar: Secondary | ICD-10-CM | POA: Diagnosis not present

## 2021-08-03 DIAGNOSIS — L732 Hidradenitis suppurativa: Secondary | ICD-10-CM | POA: Diagnosis not present

## 2021-08-11 DIAGNOSIS — L732 Hidradenitis suppurativa: Secondary | ICD-10-CM | POA: Insufficient documentation

## 2021-10-08 ENCOUNTER — Ambulatory Visit: Payer: BC Managed Care – PPO | Admitting: Physician Assistant

## 2021-10-08 VITALS — BP 122/86 | HR 93 | Temp 97.8°F | Ht 67.0 in | Wt 242.2 lb

## 2021-10-08 DIAGNOSIS — J029 Acute pharyngitis, unspecified: Secondary | ICD-10-CM | POA: Diagnosis not present

## 2021-10-08 DIAGNOSIS — R0982 Postnasal drip: Secondary | ICD-10-CM

## 2021-10-08 LAB — POCT RAPID STREP A (OFFICE): Rapid Strep A Screen: NEGATIVE

## 2021-10-08 MED ORDER — PREDNISONE 20 MG PO TABS: 20.0000 mg | ORAL_TABLET | Freq: Two times a day (BID) | ORAL | 0 refills | Status: AC

## 2021-10-08 NOTE — Progress Notes (Signed)
Subjective:    Patient ID: Sandy Ryan, female    DOB: 1995/06/29, 26 y.o.   MRN: [id number]  Chief Complaint  Patient presents with   Sore Throat    Pt c/o sore throat just starting this week but was congested last week; feels worse at night and first thing in the morning, coughing some and hurts to swallow. A little thick mucus stuck on throat and has been able to gets some out per pt.     Sore Throat   Patient is in today for the following:  Chief complaint: ST Symptom onset: one week ago, worse in the last 2 days Pertinent positives: Some nasal congestion / cough Pertinent negatives: Fever, chills, body aches, n/v/d abd pain, SOB, CP Treatments tried: Mucinex, Benadryl to help rest  Sick exposure: no known sick contacts, went to [facility] last weekend / weather change   Past Medical History:  Diagnosis Date   Asthma     Past Surgical History:  Procedure Laterality Date   ELBOW SURGERY     KNEE SURGERY Right 01/30/2015   torn meniscus and ACL   SHOULDER SURGERY     thumb surgery Left 2010    Family History  Problem Relation Age of Onset   Hypertension Mother     Social History   Tobacco Use   Smoking status: Never   Smokeless tobacco: Never  Vaping Use   Vaping Use: Never used  Substance Use Topics   Alcohol use: Yes    Comment: socially   Drug use: No     Allergies  Allergen Reactions   Tape     adhesive   Tapentadol Hives    adhesive adhesive    Wound Dressing Adhesive     adhesive   Other Rash    Review of Systems NEGATIVE UNLESS OTHERWISE INDICATED IN HPI      Objective:     BP 122/86 (BP Location: Left Arm)   Pulse 93   Temp 97.8 F (36.6 C) (Temporal)   Ht 5\' 7"  (1.702 m)   Wt 242 lb 3.2 oz (109.9 kg)   SpO2 98%   BMI 37.93 kg/m   Wt Readings from Last 3 Encounters:  10/08/21 242 lb 3.2 oz (109.9 kg)  03/31/21 255 lb (115.7 kg)  01/05/21 253 lb 6.1 oz (114.9 kg)    BP Readings from Last 3 Encounters:  10/08/21 122/86   03/31/21 117/79  01/05/21 120/80     Physical Exam Constitutional:      Appearance: She is well-developed.  HENT:     Right Ear: Tympanic membrane and ear canal normal.     Left Ear: Tympanic membrane and ear canal normal.     Mouth/Throat:     Mouth: Mucous membranes are moist. No oral lesions.     Pharynx: Uvula midline. No oropharyngeal exudate, posterior oropharyngeal erythema or uvula swelling.     Tonsils: No tonsillar exudate or tonsillar abscesses.  Eyes:     Conjunctiva/sclera: Conjunctivae normal.  Cardiovascular:     Rate and Rhythm: Normal rate and regular rhythm.     Heart sounds: Normal heart sounds. No murmur heard. Pulmonary:     Effort: Pulmonary effort is normal.     Breath sounds: Normal breath sounds.  Neurological:     Mental Status: She is alert.  Psychiatric:        Mood and Affect: Mood normal.       Assessment & Plan:   Problem List Items Addressed  This Visit   None Visit Diagnoses     Sore throat    -  Primary   Relevant Orders   POCT rapid strep A (Completed)   Postnasal drip            Meds ordered this encounter  Medications   predniSONE (DELTASONE) 20 MG tablet    Sig: Take 1 tablet (20 mg total) by mouth 2 (two) times daily with a meal for 5 days.    Dispense:  10 tablet    Refill:  0    Order Specific Question:   Supervising Provider    Answer:   Yong Channel, STEPHEN O [id number]   1. Sore throat 2. Postnasal drip Rapid strep A POC test negative. Most likely allergen / postnasal drip related sore throat. Take prednisone as directed. Push fluids. Use daily allergy medication and nasal saline x 2 weeks. Throat lozenges, tea with honey Advised against use of antibiotics   Return if symptoms worsen or fail to improve.  This note was prepared with assistance of Systems analyst. Occasional wrong-word or sound-a-like substitutions may have occurred due to the inherent limitations of voice recognition  software.    Darrel Gloss M Gaylynn Seiple, PA-C

## 2021-10-08 NOTE — Patient Instructions (Signed)
Strep throat test negative. Most likely allergen / postnasal drip related sore throat. Take prednisone as directed. Push fluids. Use daily allergy medication and nasal saline x 2 weeks. Throat lozenges, tea with honey

## 2021-10-09 ENCOUNTER — Ambulatory Visit: Payer: BC Managed Care – PPO | Admitting: Physician Assistant

## 2022-02-08 ENCOUNTER — Encounter: Payer: Self-pay | Admitting: *Deleted

## 2022-04-15 ENCOUNTER — Encounter: Payer: Self-pay | Admitting: Physician Assistant

## 2022-04-15 ENCOUNTER — Ambulatory Visit (INDEPENDENT_AMBULATORY_CARE_PROVIDER_SITE_OTHER): Payer: BC Managed Care – PPO | Admitting: Physician Assistant

## 2022-04-15 VITALS — BP 122/84 | HR 87 | Temp 98.0°F | Ht 66.54 in | Wt 248.4 lb

## 2022-04-15 DIAGNOSIS — G479 Sleep disorder, unspecified: Secondary | ICD-10-CM | POA: Diagnosis not present

## 2022-04-15 DIAGNOSIS — L732 Hidradenitis suppurativa: Secondary | ICD-10-CM | POA: Diagnosis not present

## 2022-04-15 DIAGNOSIS — Z0001 Encounter for general adult medical examination with abnormal findings: Secondary | ICD-10-CM | POA: Diagnosis not present

## 2022-04-15 DIAGNOSIS — D500 Iron deficiency anemia secondary to blood loss (chronic): Secondary | ICD-10-CM

## 2022-04-15 DIAGNOSIS — R5383 Other fatigue: Secondary | ICD-10-CM

## 2022-04-15 DIAGNOSIS — Z0000 Encounter for general adult medical examination without abnormal findings: Secondary | ICD-10-CM

## 2022-04-15 LAB — COMPREHENSIVE METABOLIC PANEL
ALT: 11 U/L (ref 0–35)
AST: 13 U/L (ref 0–37)
Albumin: 3.9 g/dL (ref 3.5–5.2)
Alkaline Phosphatase: 69 U/L (ref 39–117)
BUN: 9 mg/dL (ref 6–23)
CO2: 27 mEq/L (ref 19–32)
Calcium: 8.8 mg/dL (ref 8.4–10.5)
Chloride: 104 mEq/L (ref 96–112)
Creatinine, Ser: 0.78 mg/dL (ref 0.40–1.20)
GFR: 104.26 mL/min (ref >60.00)
Glucose, Bld: 83 mg/dL (ref 70–99)
Potassium: 4.3 mEq/L (ref 3.5–5.1)
Sodium: 138 mEq/L (ref 135–145)
Total Bilirubin: 0.3 mg/dL (ref 0.2–1.2)
Total Protein: 7.1 g/dL (ref 6.0–8.3)

## 2022-04-15 LAB — LIPID PANEL
Cholesterol: 160 mg/dL (ref 0–200)
HDL: 49.3 mg/dL (ref >39.00)
LDL Cholesterol: 101 mg/dL — ABNORMAL HIGH (ref 0–99)
NonHDL: 110.71
Total CHOL/HDL Ratio: 3
Triglycerides: 50 mg/dL (ref 0.0–149.0)
VLDL: 10 mg/dL (ref 0.0–40.0)

## 2022-04-15 LAB — CBC WITH DIFFERENTIAL/PLATELET
Basophils Absolute: 0.1 10*3/uL (ref 0.0–0.1)
Basophils Relative: 1.2 % (ref 0.0–3.0)
Eosinophils Absolute: 0.4 10*3/uL (ref 0.0–0.7)
Eosinophils Relative: 6.2 % — ABNORMAL HIGH (ref 0.0–5.0)
HCT: 35.6 % — ABNORMAL LOW (ref 36.0–46.0)
Hemoglobin: 11.3 g/dL — ABNORMAL LOW (ref 12.0–15.0)
Lymphocytes Relative: 37.9 % (ref 12.0–46.0)
Lymphs Abs: 2.4 10*3/uL (ref 0.7–4.0)
MCHC: 31.7 g/dL (ref 30.0–36.0)
MCV: 78 fl (ref 78.0–100.0)
Monocytes Absolute: 0.6 10*3/uL (ref 0.1–1.0)
Monocytes Relative: 9 % (ref 3.0–12.0)
Neutro Abs: 2.9 10*3/uL (ref 1.4–7.7)
Neutrophils Relative %: 45.7 % (ref 43.0–77.0)
Platelets: 379 10*3/uL (ref 150.0–400.0)
RBC: 4.57 Mil/uL (ref 3.87–5.11)
RDW: 17.6 % — ABNORMAL HIGH (ref 11.5–15.5)
WBC: 6.4 10*3/uL (ref 4.0–10.5)

## 2022-04-15 LAB — VITAMIN B12: Vitamin B-12: 355 pg/mL (ref 211–911)

## 2022-04-15 LAB — VITAMIN D 25 HYDROXY (VIT D DEFICIENCY, FRACTURES): VITD: 9.61 ng/mL — ABNORMAL LOW (ref 30.00–100.00)

## 2022-04-15 LAB — TSH: TSH: 1.61 u[IU]/mL (ref 0.35–5.50)

## 2022-04-15 MED ORDER — TRAZODONE HCL 50 MG PO TABS: 25.0000 mg | ORAL_TABLET | Freq: Every evening | ORAL | 3 refills | Status: DC | PRN

## 2022-04-15 MED ORDER — DOXYCYCLINE HYCLATE 100 MG PO TABS: 100.0000 mg | ORAL_TABLET | Freq: Two times a day (BID) | ORAL | 0 refills | Status: AC

## 2022-04-15 NOTE — Patient Instructions (Signed)
Great to see you today! Keep up the awesome work!  Labs today, treat pending any abnormal.   Trial trazodone as directed for sleep. Let me know how this works out.  Doxycycline 100 mg twice daily x 7-10 days for HS flare up. Try Hibiclens solution. Follow with dermatology as needed.  Call if any concerns.

## 2022-04-15 NOTE — Progress Notes (Signed)
Subjective:    Patient ID: Sandy Ryan, female    DOB: 09-18-1995, 26 y.o.   MRN: [id number]  Chief Complaint  Patient presents with   Annual Exam    Pt in office for annual CPE; pt is fasting for labs; pt c/o cyst that keep popping up and Dermatology says its HS and pt states they didn't do anything and getting worse. Pt is allergic to adhesive had to put a bandaid on right under arm area and has irritated the area and making it worse. Pt also states she has the hardest time going to sleep and is always tired after sleeping. Feels like she isn't rested.     HPI Patient is in today for annual exam.   Acute concerns: HS R underarm   Health maintenance: Lifestyle/ exercise: Working out regularly, enjoys weight-training and HIIT  Nutrition: Doing well she says  Mental health: Feels good  Sleep: averages about 4-7 hours per night, does better on days she works out, sometimes has racing thoughts at night Substance use: None  Sexual activity: Not active  Immunizations: UTD Pap: UTD  Periods: Irregular, but not heavy per patient    Past Medical History:  Diagnosis Date   Asthma     Past Surgical History:  Procedure Laterality Date   ELBOW SURGERY     KNEE SURGERY Right 01/30/2015   torn meniscus and ACL   SHOULDER SURGERY     thumb surgery Left 2010    Family History  Problem Relation Age of Onset   Hypertension Mother     Social History   Tobacco Use   Smoking status: Never   Smokeless tobacco: Never  Vaping Use   Vaping Use: Never used  Substance Use Topics   Alcohol use: Yes    Comment: socially   Drug use: No     Allergies  Allergen Reactions   Tape     adhesive   Tapentadol Hives    adhesive adhesive    Wound Dressing Adhesive     adhesive   Other Rash    Review of Systems NEGATIVE UNLESS OTHERWISE INDICATED IN HPI      Objective:     BP 122/84 (BP Location: Right Arm)   Pulse 87   Temp 98 F (36.7 C) (Oral)   Ht 5' 6.54" (1.69  m)   Wt 248 lb 6.4 oz (112.7 kg)   LMP 03/29/2022 (Approximate) Comment: Had 2 cycles this month  SpO2 98%   BMI 39.45 kg/m   Wt Readings from Last 3 Encounters:  04/15/22 248 lb 6.4 oz (112.7 kg)  10/08/21 242 lb 3.2 oz (109.9 kg)  03/31/21 255 lb (115.7 kg)    BP Readings from Last 3 Encounters:  04/15/22 122/84  10/08/21 122/86  03/31/21 117/79     Physical Exam Vitals and nursing note reviewed.  Constitutional:      Appearance: Normal appearance. She is obese. She is not toxic-appearing.  HENT:     Head: Normocephalic and atraumatic.     Right Ear: Tympanic membrane, ear canal and external ear normal.     Left Ear: Tympanic membrane, ear canal and external ear normal.     Nose: Nose normal.     Mouth/Throat:     Mouth: Mucous membranes are moist.  Eyes:     Extraocular Movements: Extraocular movements intact.     Conjunctiva/sclera: Conjunctivae normal.     Pupils: Pupils are equal, round, and reactive to light.  Cardiovascular:  Rate and Rhythm: Normal rate and regular rhythm.     Pulses: Normal pulses.     Heart sounds: Normal heart sounds.  Pulmonary:     Effort: Pulmonary effort is normal.     Breath sounds: Normal breath sounds.  Abdominal:     General: Abdomen is flat. Bowel sounds are normal.     Palpations: Abdomen is soft.  Musculoskeletal:        General: Normal range of motion.     Cervical back: Normal range of motion and neck supple.  Skin:    General: Skin is warm and dry.     Findings: Lesion (deep painful abscess approx 1 cm R axilla, non-draining) present.  Neurological:     General: No focal deficit present.     Mental Status: She is alert and oriented to person, place, and time.  Psychiatric:        Mood and Affect: Mood normal.        Behavior: Behavior normal.        Thought Content: Thought content normal.        Judgment: Judgment normal.        Assessment & Plan:  Encounter for annual physical exam -     Iron, TIBC and  Ferritin Panel -     VITAMIN D 25 Hydroxy (Vit-D Deficiency, Fractures) -     Vitamin B12 -     TSH -     Comprehensive metabolic panel -     CBC with Differential/Platelet -     Lipid panel  Iron deficiency anemia due to chronic blood loss -     Iron, TIBC and Ferritin Panel -     CBC with Differential/Platelet  Other fatigue -     Iron, TIBC and Ferritin Panel -     VITAMIN D 25 Hydroxy (Vit-D Deficiency, Fractures) -     Vitamin B12 -     TSH -     Comprehensive metabolic panel -     CBC with Differential/Platelet  Difficulty sleeping  Hidradenitis suppurativa  Other orders -     traZODone HCl; Take 0.5-1 tablets (25-50 mg total) by mouth at bedtime as needed for sleep.  Dispense: 30 tablet; Refill: 3 -     Doxycycline Hyclate; Take 1 tablet (100 mg total) by mouth 2 (two) times daily.  Dispense: 60 tablet; Refill: 0   Age-appropriate screening and counseling performed today. Will check labs and call with results. Preventive measures discussed and printed in AVS for patient.   Patient Counseling: [x]   Nutrition: Stressed importance of moderation in sodium/caffeine intake, saturated fat and cholesterol, caloric balance, sufficient intake of fresh fruits, vegetables, and fiber.  [x]   Stressed the importance of regular exercise.   []   Substance Abuse: Discussed cessation/primary prevention of tobacco, alcohol, or other drug use; driving or other dangerous activities under the influence; availability of treatment for abuse.   [x]   Injury prevention: Discussed safety belts, safety helmets, smoke detector, smoking near bedding or upholstery.   []   Sexuality: Discussed sexually transmitted diseases, partner selection, use of condoms, avoidance of unintended pregnancy  and contraceptive alternatives.   [x]   Dental health: Discussed importance of regular tooth brushing, flossing, and dental visits.  [x]   Health maintenance and immunizations reviewed. Please refer to Health maintenance  section.     Trial trazodone 25 -50 mg as directed for sleep. Let me know how this works out.  Doxycycline 100 mg twice daily x 7-10 days  for HS flare up. Try Hibiclens solution. Follow with dermatology as needed.     Return in about 1 year (around 04/16/2023) for Annual .  This note was prepared with assistance of Dragon voice recognition software. Occasional wrong-word or sound-a-like substitutions may have occurred due to the inherent limitations of voice recognition software.    Elesia Pemberton M Sherod Cisse, PA-C

## 2022-04-16 LAB — IRON,TIBC AND FERRITIN PANEL
%SAT: 9 % (calc) — ABNORMAL LOW (ref 16–45)
Ferritin: 6 ng/mL — ABNORMAL LOW (ref 16–154)
Iron: 32 ug/dL — ABNORMAL LOW (ref 40–190)
TIBC: 375 mcg/dL (calc) (ref 250–450)

## 2022-04-19 ENCOUNTER — Other Ambulatory Visit: Payer: Self-pay

## 2022-04-19 MED ORDER — VITAMIN D (ERGOCALCIFEROL) 1.25 MG (50000 UNIT) PO CAPS: 50000.0000 [IU] | ORAL_CAPSULE | ORAL | 0 refills | Status: DC

## 2022-09-21 ENCOUNTER — Other Ambulatory Visit: Payer: Self-pay | Admitting: Physician Assistant

## 2022-09-21 ENCOUNTER — Encounter: Payer: Self-pay | Admitting: Physician Assistant

## 2022-10-21 ENCOUNTER — Ambulatory Visit: Payer: Self-pay | Admitting: Physician Assistant

## 2022-10-25 ENCOUNTER — Ambulatory Visit (INDEPENDENT_AMBULATORY_CARE_PROVIDER_SITE_OTHER): Payer: BC Managed Care – PPO | Admitting: Physician Assistant

## 2022-10-25 VITALS — BP 118/80 | HR 80 | Temp 97.8°F | Ht 66.54 in | Wt 235.4 lb

## 2022-10-25 DIAGNOSIS — D500 Iron deficiency anemia secondary to blood loss (chronic): Secondary | ICD-10-CM

## 2022-10-25 DIAGNOSIS — E559 Vitamin D deficiency, unspecified: Secondary | ICD-10-CM | POA: Insufficient documentation

## 2022-10-25 DIAGNOSIS — R197 Diarrhea, unspecified: Secondary | ICD-10-CM

## 2022-10-25 LAB — CBC WITH DIFFERENTIAL/PLATELET
Basophils Absolute: 0 10*3/uL (ref 0.0–0.1)
Basophils Relative: 0.5 % (ref 0.0–3.0)
Eosinophils Absolute: 0.3 10*3/uL (ref 0.0–0.7)
Eosinophils Relative: 4.6 % (ref 0.0–5.0)
HCT: 36.4 % (ref 36.0–46.0)
Hemoglobin: 11.5 g/dL — ABNORMAL LOW (ref 12.0–15.0)
Lymphocytes Relative: 40.7 % (ref 12.0–46.0)
Lymphs Abs: 2.3 10*3/uL (ref 0.7–4.0)
MCHC: 31.6 g/dL (ref 30.0–36.0)
MCV: 78.6 fl (ref 78.0–100.0)
Monocytes Absolute: 0.4 10*3/uL (ref 0.1–1.0)
Monocytes Relative: 7.6 % (ref 3.0–12.0)
Neutro Abs: 2.6 10*3/uL (ref 1.4–7.7)
Neutrophils Relative %: 46.6 % (ref 43.0–77.0)
Platelets: 371 10*3/uL (ref 150.0–400.0)
RBC: 4.64 Mil/uL (ref 3.87–5.11)
RDW: 18.1 % — ABNORMAL HIGH (ref 11.5–15.5)
WBC: 5.7 10*3/uL (ref 4.0–10.5)

## 2022-10-25 LAB — COMPREHENSIVE METABOLIC PANEL
ALT: 12 U/L (ref 0–35)
AST: 15 U/L (ref 0–37)
Albumin: 3.7 g/dL (ref 3.5–5.2)
Alkaline Phosphatase: 76 U/L (ref 39–117)
BUN: 8 mg/dL (ref 6–23)
CO2: 24 mEq/L (ref 19–32)
Calcium: 8.8 mg/dL (ref 8.4–10.5)
Chloride: 106 mEq/L (ref 96–112)
Creatinine, Ser: 0.79 mg/dL (ref 0.40–1.20)
GFR: 102.3 mL/min (ref >60.00)
Glucose, Bld: 88 mg/dL (ref 70–99)
Potassium: 4.4 mEq/L (ref 3.5–5.1)
Sodium: 138 mEq/L (ref 135–145)
Total Bilirubin: 0.3 mg/dL (ref 0.2–1.2)
Total Protein: 6.9 g/dL (ref 6.0–8.3)

## 2022-10-25 LAB — TSH: TSH: 0.91 u[IU]/mL (ref 0.35–5.50)

## 2022-10-25 LAB — VITAMIN D 25 HYDROXY (VIT D DEFICIENCY, FRACTURES): VITD: 12.2 ng/mL — ABNORMAL LOW (ref 30.00–100.00)

## 2022-10-25 LAB — C-REACTIVE PROTEIN: CRP: <1 mg/dL (ref 0.5–20.0)

## 2022-10-25 MED ORDER — DICYCLOMINE HCL 10 MG PO CAPS: 10.0000 mg | ORAL_CAPSULE | Freq: Three times a day (TID) | ORAL | 0 refills | Status: AC

## 2022-10-25 NOTE — Progress Notes (Signed)
Subjective:    Patient ID: Sandy Ryan, female    DOB: 04/27/1996, 27 y.o.   MRN: [id number]  Chief Complaint  Patient presents with   Diarrhea    Pt in office c/o diarrhea and unable to hold down food since May 19th. Patient says she isn't vomiting just diarrhea. Thought it could've been food related at first due to color of stool at first being teal/green. Color now changes sometimes but knows not normal.     Diarrhea    Patient is in today for diarrhea.  May 18th - went out for brother's birthday at [employer], burger and fries, lemonade, Sprite, pizza  May 19th- started with diarrhea. Hasn't stopped since then. No solid stools in that time. This morning 4-5 times, dark green. Sometimes brown. 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 No red or black stools. No pain in abdomen. After she eats, immediately has to have bowel movement. Not wanting to eat now. Taking Pepto-before she does eat.  No odor to the stool.   Hasn't felt bad. No n/v. No abdominal pain. No fever or chills. No nighttime awakenings.   No new supplements. Only takes Trazodone for sleep.   Takes doxycycline for HS when she has flare-ups. Thinks she might have taken some prior to this diarrhea flare-up. Doesn't eat yogurt or take probiotics.   No family with symptoms. No recent travel.    Past Medical History:  Diagnosis Date   Asthma     Past Surgical History:  Procedure Laterality Date   ELBOW SURGERY     KNEE SURGERY Right 01/30/2015   torn meniscus and ACL   SHOULDER SURGERY     thumb surgery Left 2010    Family History  Problem Relation Age of Onset   Hypertension Mother     Social History   Tobacco Use   Smoking status: Never   Smokeless tobacco: Never  Vaping Use   Vaping Use: Never used  Substance Use Topics   Alcohol use: Yes    Comment: socially   Drug use: No     Allergies  Allergen Reactions   Tape     adhesive   Tapentadol Hives    adhesive adhesive    Wound Dressing  Adhesive     adhesive   Other Rash    Review of Systems  Gastrointestinal:  Positive for diarrhea.   NEGATIVE UNLESS OTHERWISE INDICATED IN HPI      Objective:     BP 118/80 (BP Location: Left Arm)   Pulse 80   Temp 97.8 F (36.6 C) (Temporal)   Ht 5' 6.54" (1.69 m)   Wt 235 lb 6.4 oz (106.8 kg)   SpO2 98%   BMI 37.38 kg/m   Wt Readings from Last 3 Encounters:  10/25/22 235 lb 6.4 oz (106.8 kg)  04/15/22 248 lb 6.4 oz (112.7 kg)  10/08/21 242 lb 3.2 oz (109.9 kg)    BP Readings from Last 3 Encounters:  10/25/22 118/80  04/15/22 122/84  10/08/21 122/86     Physical Exam Vitals and nursing note reviewed.  Constitutional:      Appearance: Normal appearance. She is obese. She is not toxic-appearing.  HENT:     Head: Normocephalic and atraumatic.     Right Ear: External ear normal.     Left Ear: External ear normal.     Nose: Nose normal.     Mouth/Throat:     Mouth: Mucous membranes are moist.  Eyes:  Extraocular Movements: Extraocular movements intact.     Conjunctiva/sclera: Conjunctivae normal.     Pupils: Pupils are equal, round, and reactive to light.  Cardiovascular:     Rate and Rhythm: Normal rate and regular rhythm.     Pulses: Normal pulses.     Heart sounds: Normal heart sounds.  Pulmonary:     Effort: Pulmonary effort is normal.     Breath sounds: Normal breath sounds.  Abdominal:     General: Abdomen is flat. Bowel sounds are normal. There is no distension.     Palpations: Abdomen is soft. There is no mass.     Tenderness: There is no abdominal tenderness. There is no right CVA tenderness, left CVA tenderness or guarding.  Musculoskeletal:        General: Normal range of motion.     Cervical back: Normal range of motion and neck supple.  Skin:    General: Skin is warm and dry.  Neurological:     General: No focal deficit present.     Mental Status: She is alert and oriented to person, place, and time.  Psychiatric:        Mood and  Affect: Mood normal.        Behavior: Behavior normal.        Thought Content: Thought content normal.        Judgment: Judgment normal.        Assessment & Plan:  Diarrhea, unspecified type -     GI Profile, Stool, PCR -     Clostridium difficile culture-fecal -     CBC with Differential/Platelet -     Comprehensive metabolic panel -     TSH -     C-reactive protein -     VITAMIN D 25 Hydroxy (Vit-D Deficiency, Fractures) -     Iron, TIBC and Ferritin Panel -     POCT occult blood stool, device  Vitamin D deficiency Assessment & Plan: Update labs today   Orders: -     VITAMIN D 25 Hydroxy (Vit-D Deficiency, Fractures)  Iron deficiency anemia due to chronic blood loss Assessment & Plan: Update labs today   Orders: -     CBC with Differential/Platelet -     Iron, TIBC and Ferritin Panel -     POCT occult blood stool, device  Other orders -     Dicyclomine HCl; Take 1 capsule (10 mg total) by mouth 4 (four) times daily -  before meals and at bedtime.  Dispense: 120 capsule; Refill: 0    Diarrhea, unknown etiology x nearly 4 weeks. Plan to check labs and stool cultures. Need to r/o C. Diff, esp with hx of intermittent use of doxycycline for HS. No red flags on exam. Discussed these with pt of when / if needs to seek emergent care.  AVS printed for pt with these additional guidelines:   Please return stool samples as requested, need to make sure no serious infections in the colon.  Start on Bentyl as directed to help with cramping and to lessen frequent bowel movements. May add Imodium for a few days as well.  Start with clear liquid diet x 24-48 hours, then start to incorporate oats, rice, toast, yogurt, plain chicken, bananas, etc.   Consider GI referral.  If any blood in stool, fevers, or severe abdominal pain or symptoms, go to the Emergency Dept.    Return if symptoms worsen or fail to improve.   Jakavion Bilodeau M Demetries Coia, PA-C

## 2022-10-25 NOTE — Assessment & Plan Note (Signed)
Update labs today 

## 2022-10-25 NOTE — Patient Instructions (Signed)
Let's recheck labs today, treat pending any abnormal results.  Please return stool samples as requested, need to make sure no serious infections in the colon.  Start on Bentyl as directed to help with cramping and to lessen frequent bowel movements. May add Imodium for a few days as well.  Start with clear liquid diet x 24-48 hours, then start to incorporate oats, rice, toast, yogurt, plain chicken, bananas, etc.   Consider GI referral.  If any blood in stool, fevers, or severe abdominal pain or symptoms, go to the Emergency Dept.

## 2022-10-26 LAB — IRON,TIBC AND FERRITIN PANEL
%SAT: 4 % (calc) — ABNORMAL LOW (ref 16–45)
Ferritin: 3 ng/mL — ABNORMAL LOW (ref 16–154)
Iron: 16 ug/dL — ABNORMAL LOW (ref 40–190)
TIBC: 360 mcg/dL (calc) (ref 250–450)

## 2022-10-27 ENCOUNTER — Ambulatory Visit: Payer: Self-pay | Admitting: Physician Assistant

## 2022-10-27 LAB — GI PROFILE, STOOL, PCR

## 2022-10-30 LAB — CLOSTRIDIUM DIFFICILE CULTURE-FECAL

## 2022-11-01 ENCOUNTER — Other Ambulatory Visit: Payer: Self-pay

## 2022-11-01 DIAGNOSIS — R197 Diarrhea, unspecified: Secondary | ICD-10-CM

## 2022-11-01 MED ORDER — VITAMIN D (ERGOCALCIFEROL) 1.25 MG (50000 UNIT) PO CAPS: 50000.0000 [IU] | ORAL_CAPSULE | ORAL | 0 refills | Status: DC

## 2022-11-10 ENCOUNTER — Ambulatory Visit
Admission: RE | Admit: 2022-11-10 | Discharge: 2022-11-10 | Disposition: A | Discharge status: Home / Self Care | Payer: BC Managed Care – PPO | Source: Ambulatory Visit

## 2022-11-10 ENCOUNTER — Ambulatory Visit: Payer: BC Managed Care – PPO

## 2022-11-10 VITALS — BP 124/85 | HR 81 | Temp 98.2°F | Resp 18

## 2022-11-10 DIAGNOSIS — M25561 Pain in right knee: Secondary | ICD-10-CM

## 2022-11-10 MED ORDER — MELOXICAM 15 MG PO TABS: 15.0000 mg | ORAL_TABLET | Freq: Every day | ORAL | 1 refills | Status: AC
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
## 2022-11-16 ENCOUNTER — Other Ambulatory Visit: Payer: Self-pay | Admitting: Physician Assistant

## 2022-11-16 DIAGNOSIS — S83241A Other tear of medial meniscus, current injury, right knee, initial encounter: Secondary | ICD-10-CM | POA: Diagnosis not present

## 2023-01-20 ENCOUNTER — Encounter: Payer: Self-pay | Admitting: Gastroenterology

## 2023-03-02 ENCOUNTER — Ambulatory Visit: Payer: BC Managed Care – PPO | Admitting: Physician Assistant

## 2023-03-02 VITALS — BP 126/86 | HR 83 | Temp 97.8°F | Ht 66.0 in | Wt 236.2 lb

## 2023-03-02 DIAGNOSIS — Z23 Encounter for immunization: Secondary | ICD-10-CM | POA: Diagnosis not present

## 2023-03-02 DIAGNOSIS — F419 Anxiety disorder, unspecified: Secondary | ICD-10-CM

## 2023-03-02 DIAGNOSIS — E559 Vitamin D deficiency, unspecified: Secondary | ICD-10-CM | POA: Diagnosis not present

## 2023-03-02 DIAGNOSIS — F32A Depression, unspecified: Secondary | ICD-10-CM

## 2023-03-02 DIAGNOSIS — D500 Iron deficiency anemia secondary to blood loss (chronic): Secondary | ICD-10-CM

## 2023-03-02 DIAGNOSIS — R41840 Attention and concentration deficit: Secondary | ICD-10-CM

## 2023-03-02 DIAGNOSIS — R5383 Other fatigue: Secondary | ICD-10-CM

## 2023-03-02 DIAGNOSIS — R197 Diarrhea, unspecified: Secondary | ICD-10-CM | POA: Diagnosis not present

## 2023-03-02 LAB — CBC WITH DIFFERENTIAL/PLATELET
Basophils Absolute: 0 10*3/uL (ref 0.0–0.1)
Basophils Relative: 0.5 % (ref 0.0–3.0)
Eosinophils Absolute: 0.1 10*3/uL (ref 0.0–0.7)
Eosinophils Relative: 1.9 % (ref 0.0–5.0)
HCT: 37.5 % (ref 36.0–46.0)
Hemoglobin: 11.6 g/dL — ABNORMAL LOW (ref 12.0–15.0)
Lymphocytes Relative: 33.8 % (ref 12.0–46.0)
Lymphs Abs: 2.3 10*3/uL (ref 0.7–4.0)
MCHC: 31 g/dL (ref 30.0–36.0)
MCV: 80.1 fL (ref 78.0–100.0)
Monocytes Absolute: 0.6 10*3/uL (ref 0.1–1.0)
Monocytes Relative: 8.1 % (ref 3.0–12.0)
Neutro Abs: 3.8 10*3/uL (ref 1.4–7.7)
Neutrophils Relative %: 55.7 % (ref 43.0–77.0)
Platelets: 394 10*3/uL (ref 150.0–400.0)
RBC: 4.68 Mil/uL (ref 3.87–5.11)
RDW: 17.9 % — ABNORMAL HIGH (ref 11.5–15.5)
WBC: 6.8 10*3/uL (ref 4.0–10.5)

## 2023-03-02 LAB — COMPREHENSIVE METABOLIC PANEL
ALT: 9 U/L (ref 0–35)
AST: 12 U/L (ref 0–37)
Albumin: 3.7 g/dL (ref 3.5–5.2)
Alkaline Phosphatase: 79 U/L (ref 39–117)
BUN: 10 mg/dL (ref 6–23)
CO2: 26 meq/L (ref 19–32)
Calcium: 9 mg/dL (ref 8.4–10.5)
Chloride: 104 meq/L (ref 96–112)
Creatinine, Ser: 0.84 mg/dL (ref 0.40–1.20)
GFR: 94.8 mL/min (ref >60.00)
Glucose, Bld: 81 mg/dL (ref 70–99)
Potassium: 3.7 meq/L (ref 3.5–5.1)
Sodium: 136 meq/L (ref 135–145)
Total Bilirubin: 0.3 mg/dL (ref 0.2–1.2)
Total Protein: 7.5 g/dL (ref 6.0–8.3)

## 2023-03-02 LAB — VITAMIN D 25 HYDROXY (VIT D DEFICIENCY, FRACTURES): VITD: 11.8 ng/mL — ABNORMAL LOW (ref 30.00–100.00)

## 2023-03-02 LAB — IBC + FERRITIN
Ferritin: 4 ng/mL — ABNORMAL LOW (ref 10.0–291.0)
Iron: 22 ug/dL — ABNORMAL LOW (ref 42–145)
Saturation Ratios: 5.3 % — ABNORMAL LOW (ref 20.0–50.0)
TIBC: 411.6 ug/dL (ref 250.0–450.0)
Transferrin: 294 mg/dL (ref 212.0–360.0)

## 2023-03-02 LAB — VITAMIN B12: Vitamin B-12: 269 pg/mL (ref 211–911)

## 2023-03-02 LAB — TSH: TSH: 1.31 u[IU]/mL (ref 0.35–5.50)

## 2023-03-02 MED ORDER — BUPROPION HCL ER (XL) 150 MG PO TB24
150.0000 mg | ORAL_TABLET | Freq: Every day | ORAL | 2 refills | Status: DC
Start: 2023-03-02 — End: 2024-03-30

## 2023-03-02 NOTE — Progress Notes (Signed)
Subjective:    Patient ID: Sandy Ryan, female    DOB: 04-20-1996, 27 y.o.   MRN: [id number]  Chief Complaint  Patient presents with   Anxiety    Pt in the office to discuss anxiety, insomnia and mood swings; pt states worse the past month, always had issues sleeping. Trazodone worked at first, then stopped working. Mind racing, pt admits no life changes, added stress, so not sure where all this is coming from. Pt has also been having random crying spells, stopped working out, no energy.    Shoulder Pain    Pt experiencing right shoulder pain and now pain is radiating to wrist having to use Biofreeze to help.     HPI Discussed the use of AI scribe software for clinical note transcription with the patient, who gave verbal consent to proceed.  History of Present Illness   The patient, with a history of low iron levels, presents with a one to two-month history of feeling sad, having no energy, and not feeling like herself. She reports difficulty sleeping, often waking up early in the morning despite going to bed late. She also reports decreased focus at work, which has affected her performance. The patient has been experiencing gastrointestinal issues, with frequent bowel movements following meals. This has been ongoing since the middle of August and has caused the patient to feel anxious about eating. The patient also reports a decrease in her usual physical activity, often making excuses to avoid workouts. She notes that she felt happier and more like herself during a recent trip to [place], but these feelings of sadness and low energy returned upon coming back to her usual environment.        Past Medical History:  Diagnosis Date   Asthma     Past Surgical History:  Procedure Laterality Date   ELBOW SURGERY     KNEE SURGERY Right 01/30/2015   torn meniscus and ACL   SHOULDER SURGERY     thumb surgery Left 2010    Family History  Problem Relation Age of Onset   Hypertension  Mother     Social History   Tobacco Use   Smoking status: Never   Smokeless tobacco: Never  Vaping Use   Vaping status: Never Used  Substance Use Topics   Alcohol use: Yes    Comment: socially   Drug use: No     Allergies  Allergen Reactions   Tape     adhesive   Tapentadol Hives    adhesive adhesive    Wound Dressing Adhesive     adhesive   Other Rash    Review of Systems NEGATIVE UNLESS OTHERWISE INDICATED IN HPI      Objective:     BP 126/86 (BP Location: Left Arm)   Pulse 83   Temp 97.8 F (36.6 C) (Temporal)   Ht 5\' 6"  (1.676 m)   Wt 236 lb 3.2 oz (107.1 kg)   SpO2 97%   BMI 38.12 kg/m   Wt Readings from Last 3 Encounters:  03/02/23 236 lb 3.2 oz (107.1 kg)  10/25/22 235 lb 6.4 oz (106.8 kg)  04/15/22 248 lb 6.4 oz (112.7 kg)    BP Readings from Last 3 Encounters:  03/02/23 126/86  11/10/22 124/85  10/25/22 118/80     Physical Exam Vitals and nursing note reviewed.  Constitutional:      Appearance: Normal appearance. She is obese. She is not toxic-appearing.  HENT:     Head: Normocephalic and  atraumatic.     Right Ear: External ear normal.     Left Ear: External ear normal.     Nose: Nose normal.     Mouth/Throat:     Mouth: Mucous membranes are moist.  Eyes:     Extraocular Movements: Extraocular movements intact.     Conjunctiva/sclera: Conjunctivae normal.     Pupils: Pupils are equal, round, and reactive to light.  Cardiovascular:     Rate and Rhythm: Normal rate and regular rhythm.     Pulses: Normal pulses.     Heart sounds: Normal heart sounds.  Pulmonary:     Effort: Pulmonary effort is normal.     Breath sounds: Normal breath sounds.  Musculoskeletal:        General: Normal range of motion.     Cervical back: Normal range of motion and neck supple.  Skin:    General: Skin is warm and dry.  Neurological:     General: No focal deficit present.     Mental Status: She is alert and oriented to person, place, and time.   Psychiatric:        Mood and Affect: Mood normal.        Behavior: Behavior normal.        Assessment & Plan:  Anxiety and depression -     Ambulatory referral to Psychiatry -     Ambulatory referral to Psychology -     buPROPion HCl ER (XL); Take 1 tablet (150 mg total) by mouth daily. Take in the morning.  Dispense: 30 tablet; Refill: 2  Diarrhea, unspecified type -     TSH -     Comprehensive metabolic panel -     CBC with Differential/Platelet -     Fecal lactoferrin, quant; Future -     Fecal occult blood, imunochemical; Future  Vitamin D deficiency -     VITAMIN D 25 Hydroxy (Vit-D Deficiency, Fractures)  Iron deficiency anemia due to chronic blood loss -     CBC with Differential/Platelet -     IBC + Ferritin  Inattention -     Ambulatory referral to Psychiatry -     Ambulatory referral to Psychology -     buPROPion HCl ER (XL); Take 1 tablet (150 mg total) by mouth daily. Take in the morning.  Dispense: 30 tablet; Refill: 2  Other fatigue -     VITAMIN D 25 Hydroxy (Vit-D Deficiency, Fractures) -     Vitamin B12 -     TSH -     Comprehensive metabolic panel -     CBC with Differential/Platelet -     IBC + Ferritin  Immunization due -     Flu vaccine trivalent PF, 6mos and older(Flulaval,Afluria,Fluarix,Fluzone)    Assessment and Plan    Depressive Symptoms Reports of sadness, lack of energy, difficulty sleeping, and decreased focus at work for the past 1-2 months. No clear triggers identified. -Referral to [facility] for potential undiagnosed ADHD evaluation. -Referral to in-house psychologist for counseling. -Consider starting Wellbutrin after patient reviews information about the medication. -Schedule follow-up appointment in six weeks.  Gastrointestinal Disturbance Reports of frequent bowel movements and discomfort after eating. Previous stool test and doxycycline treatment did not resolve symptoms. -Order fecal lactoferrin and  occult blood tests to rule out inflammatory bowel disease and occult blood in stool. -Continue with scheduled GI appointment in November.  Iron Deficiency History of low iron levels, currently not on iron supplements due to  stomach discomfort. -Check CBC and iron levels today. -Consider resuming iron supplementation depending on lab results.  General Health Maintenance -Administer flu shot today. -Encourage patient to spend time outside in the sun daily.          Return in about 6 weeks (around 04/13/2023) for recheck/follow-up.   Afnan Emberton M Nazario Russom, PA-C

## 2023-03-03 ENCOUNTER — Other Ambulatory Visit: Payer: BC Managed Care – PPO

## 2023-03-03 ENCOUNTER — Ambulatory Visit: Payer: BC Managed Care – PPO

## 2023-03-03 DIAGNOSIS — R197 Diarrhea, unspecified: Secondary | ICD-10-CM | POA: Diagnosis not present

## 2023-03-03 LAB — FECAL OCCULT BLOOD, IMMUNOCHEMICAL: Fecal Occult Bld: NEGATIVE

## 2023-03-04 LAB — FECAL LACTOFERRIN, QUANT
Fecal Lactoferrin: NEGATIVE
MICRO NUMBER:: 15608293
SPECIMEN QUALITY:: ADEQUATE

## 2023-03-08 ENCOUNTER — Other Ambulatory Visit: Payer: Self-pay

## 2023-03-08 ENCOUNTER — Encounter: Payer: Self-pay | Admitting: Physician Assistant

## 2023-03-08 DIAGNOSIS — E559 Vitamin D deficiency, unspecified: Secondary | ICD-10-CM

## 2023-03-08 MED ORDER — VITAMIN D (ERGOCALCIFEROL) 1.25 MG (50000 UNIT) PO CAPS
50000.0000 [IU] | ORAL_CAPSULE | ORAL | 0 refills | Status: DC
Start: 2023-03-08 — End: 2023-05-27

## 2023-03-21 ENCOUNTER — Ambulatory Visit (INDEPENDENT_AMBULATORY_CARE_PROVIDER_SITE_OTHER): Payer: BC Managed Care – PPO | Admitting: Psychology

## 2023-03-21 DIAGNOSIS — F419 Anxiety disorder, unspecified: Secondary | ICD-10-CM

## 2023-03-21 DIAGNOSIS — F331 Major depressive disorder, recurrent, moderate: Secondary | ICD-10-CM | POA: Diagnosis not present
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
## 2023-03-29 ENCOUNTER — Ambulatory Visit: Payer: BC Managed Care – PPO | Admitting: Psychology

## 2023-03-29 DIAGNOSIS — F419 Anxiety disorder, unspecified: Secondary | ICD-10-CM

## 2023-03-29 DIAGNOSIS — F331 Major depressive disorder, recurrent, moderate: Secondary | ICD-10-CM | POA: Diagnosis not present

## 2023-03-29 NOTE — Progress Notes (Signed)
[facility] Counselor/Therapist Progress Note  Patient ID: Sandy Ryan, MRN: [id number],    Date: 03/29/2023  Time Spent: 9:01am-9:59am  Treatment Type: Individual Therapy  Pt is seen for a virtual video visit via caregility. Pt joins from her home, reporting privacy, and counselor from her home office. Pt consents to virtual visit and is aware of limitations of such visit.   Reported Symptoms: Pt reports some decreased anxiety.  Pt reports connected w/ friends.  Pt reports recent set boundaries w/ unhealthy relationship.   Mental Status Exam: Appearance:  Well Groomed     Behavior: Appropriate  Motor: Normal  Speech/Language:  Clear and Coherent and Normal Rate  Affect: Appropriate  Mood: anxious and depressed  Thought process: normal  Thought content:   WNL  Sensory/Perceptual disturbances:   WNL  Orientation: oriented to person, place, time/date, and situation  Attention: Good  Concentration: Good  Memory: WNL  Fund of knowledge:  Good  Insight:   Good  Judgment:  Good  Impulse Control: Good   Risk Assessment: Danger to Self:  No Self-injurious Behavior: No Danger to Others: No Duty to Warn:no Physical Aggression / Violence:No  Access to Firearms a concern: No  Gang Involvement:No   Subjective: Counselor assessed pt current functioning per pt report.  Developed tx plan w/ pt input and discussed therapeutic process.  Processed w/ pt mood and contributing factors.  Explored positive healthy connections and engaging in those connections.  Discussed upcoming holidays and decisions re: how to split time.  Encouraged to maintain no contact pt identified unhealthy relationship.   Pt affect wnl.  Pt reported on positives of attending friend wedding over weekend and plans to met w/ friend for gender reveal.  Pt discussed her goals for tx. Pt reports on stressors and strain in relationship w/ girlfriend and recent break up.  Pt reported back together but no  conversation resolving.  Pt discussed struggles w/ communication on both sides of relationship.  Pt reported on ending contact w/ another relationship that pt identified as unhealthy.    Interventions: Cognitive Behavioral Therapy, Assertiveness/Communication, and supporitive  Diagnosis:Major depressive disorder, recurrent episode, moderate (HCC)  Anxiety disorder, unspecified type  Plan: Pt to f/u w/ weekly counseling.    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  Enjoys Play video games.  I paint from time to time.  I DJ since 2024.  go to topgolf a lot.   Supports: girlfriend- Sandy Ryan and mom.    Goal/Needs for Treatment:  In order of importance to patient 1) increased coping to reduce anxiety and depression 2) increase verbal expression of feelings. 3) ---   Client Statement of Needs: pt "better way of how to manage it (depression, anxiety).  not isolate myself from the world, find other outlets to calm it.  I'm a person who has a hard time letting people in with what is going on w/ me."   Treatment Level: outpatient counseling.    Symptoms:anxious/ on edge.  Sleep disturbance.  Loss of interest.  Down and negative self talk.  Client Treatment Preferences: weekly counseling initially and then biweekly.  Continue PCP for medication management. Pt is scheduled for ADHD testing in December and GI specialist next week.     Healthcare consumer's goal for treatment:  Counselor, Forde Radon, [facility] will support the patient's ability to achieve the goals identified. Cognitive Behavioral Therapy, Assertive Communication/Conflict Resolution Training, Relaxation Training, ACT, Humanistic and other evidenced-based practices will be used to  promote progress towards healthy functioning.   Healthcare consumer will: Actively participate in therapy, working towards healthy functioning.    *Justification for Continuation/Discontinuation of Goal:  R=Revised, O=Ongoing, A=Achieved, D=Discontinued  Goal 1) Increased effective coping skills to reduce anxiety and depression AEB decreased feeing anxious/on edge, decrease sleep disturbance, decrease loss of interest, decreased withdrawing from others and decrease negative self talk.  Baseline date 03/29/23: Progress towards goal 0; How Often - Daily Target Date Goal Was reviewed Status Code Progress towards goal/Likert rating  03/28/24                Goal 2) Increased identifying emotions and increasing effective communication/verbal of emotions to supports AEB pt report and therapist observation. Baseline date 03/29/23: Progress towards goal 0; How Often - Daily Target Date Goal Was reviewed Status Code Progress towards goal  03/28/24                  This plan has been reviewed and created by the following participants:  This plan will be reviewed at least every 12 months. Date Behavioral Health Clinician Date Guardian/Patient   03/29/23  [facility] Sandy Ryan 03/29/23 Verbal Consent Provided                    Forde Radon, [facility]

## 2023-04-04 ENCOUNTER — Ambulatory Visit (INDEPENDENT_AMBULATORY_CARE_PROVIDER_SITE_OTHER): Payer: BC Managed Care – PPO | Admitting: Psychology

## 2023-04-04 DIAGNOSIS — F331 Major depressive disorder, recurrent, moderate: Secondary | ICD-10-CM | POA: Diagnosis not present

## 2023-04-04 DIAGNOSIS — F419 Anxiety disorder, unspecified: Secondary | ICD-10-CM

## 2023-04-04 NOTE — Progress Notes (Signed)
[facility] Counselor/Therapist Progress Note  Patient ID: Sandy Ryan, MRN: [id number],    Date: 04/04/2023  Time Spent: 10:01am-11:00am  Treatment Type: Individual Therapy  Pt is seen for a virtual video visit via caregility. Pt joins from her home, reporting privacy, and counselor from her home office. Pt consents to virtual visit and is aware of limitations of such visit.   Reported Symptoms: Pt reports some decreased anxiety.  Pt reports sadness re: absent father.  Mental Status Exam: Appearance:  Well Groomed     Behavior: Appropriate  Motor: Normal  Speech/Language:  Clear and Coherent and Normal Rate  Affect: Appropriate  Mood: anxious and depressed  Thought process: normal  Thought content:   WNL  Sensory/Perceptual disturbances:   WNL  Orientation: oriented to person, place, time/date, and situation  Attention: Good  Concentration: Good  Memory: WNL  Fund of knowledge:  Good  Insight:   Good  Judgment:  Good  Impulse Control: Good   Risk Assessment: Danger to Self:  No Self-injurious Behavior: No Danger to Others: No Duty to Warn:no Physical Aggression / Violence:No  Access to Firearms a concern: No  Gang Involvement:No   Subjective: Counselor assessed pt current functioning per pt report.  Processed w/ pt mood and engagement in activities.  Explored decisions for upcoming holidays and recognizing avoidance.  Discussed feeling re: lack of relationship w/ dad and his absence in her life.  not addressing tension between mom and girlfriend won't resolve.  Pt affect wnl.  Pt reported that anxiety and down mood has been less. Pt reported that her great grandmother died at 8 this weekend and that present w/ mom to go to visit.  Pt discussed how she is maintaining boundaries re: unhealthy relationship.  Pt reported on positives with girlfriend and communication/engagement.  Pt reported that plans to visit mom for thanksgiving day and then head to  girlfriend's family.  Pt discussed how would like things to resolve by mom and girlfriend talking but not likely for them to initiate and aware that avoidance won't resolve.  Pt expressed hurt that dad didn't choose to be involved and even as adult and deciding level of boundaries she wants.    Interventions: Cognitive Behavioral Therapy, Assertiveness/Communication, and supporitive  Diagnosis:Major depressive disorder, recurrent episode, moderate (HCC)  Anxiety disorder, unspecified type  Plan: Pt to f/u w/ weekly counseling.    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  Enjoys Play video games.  I paint from time to time.  I DJ since 2024.  go to topgolf a lot.   Supports: girlfriend- Lyric and mom.    Goal/Needs for Treatment:  In order of importance to patient 1) increased coping to reduce anxiety and depression 2) increase verbal expression of feelings. 3) ---   Client Statement of Needs: pt "better way of how to manage it (depression, anxiety).  not isolate myself from the world, find other outlets to calm it.  I'm a person who has a hard time letting people in with what is going on w/ me."   Treatment Level: outpatient counseling.    Symptoms:anxious/ on edge.  Sleep disturbance.  Loss of interest.  Down and negative self talk.  Client Treatment Preferences: weekly counseling initially and then biweekly.  Continue PCP for medication management. Pt is scheduled for ADHD testing in December and GI specialist next week.     Healthcare consumer's goal for treatment:  Counselor, Forde Radon, [facility] will support the patient's  ability to achieve the goals identified. Cognitive Behavioral Therapy, Assertive Communication/Conflict Resolution Training, Relaxation Training, ACT, Humanistic and other evidenced-based practices will be used to promote progress towards healthy functioning.   Healthcare consumer will: Actively participate in  therapy, working towards healthy functioning.    *Justification for Continuation/Discontinuation of Goal: R=Revised, O=Ongoing, A=Achieved, D=Discontinued  Goal 1) Increased effective coping skills to reduce anxiety and depression AEB decreased feeing anxious/on edge, decrease sleep disturbance, decrease loss of interest, decreased withdrawing from others and decrease negative self talk.  Baseline date 03/29/23: Progress towards goal 0; How Often - Daily Target Date Goal Was reviewed Status Code Progress towards goal/Likert rating  03/28/24                Goal 2) Increased identifying emotions and increasing effective communication/verbal of emotions to supports AEB pt report and therapist observation. Baseline date 03/29/23: Progress towards goal 0; How Often - Daily Target Date Goal Was reviewed Status Code Progress towards goal  03/28/24                  This plan has been reviewed and created by the following participants:  This plan will be reviewed at least every 12 months. Date Behavioral Health Clinician Date Guardian/Patient   03/29/23  [facility] Ophelia Charter 03/29/23 Verbal Consent Provided                         Forde Radon, [facility]

## 2023-04-05 ENCOUNTER — Encounter: Payer: Self-pay | Admitting: Gastroenterology

## 2023-04-05 ENCOUNTER — Ambulatory Visit (INDEPENDENT_AMBULATORY_CARE_PROVIDER_SITE_OTHER): Payer: BC Managed Care – PPO | Admitting: Gastroenterology

## 2023-04-05 VITALS — BP 118/70 | HR 64 | Ht 66.0 in | Wt 240.0 lb

## 2023-04-05 DIAGNOSIS — K529 Noninfective gastroenteritis and colitis, unspecified: Secondary | ICD-10-CM | POA: Diagnosis not present

## 2023-04-05 NOTE — Progress Notes (Signed)
04/05/2023 Sandy Ryan [id number] 23-Jul-1995   HISTORY OF PRESENT ILLNESS: This is a pleasant 27 year old female who is new to our office.  She was referred here by Ila Mcgill, PA-C, for evaluation of diarrhea.  She says that starting on May 19 she had sudden onset of diarrhea.  Started out that it was a very strange color, described as being teal.  That lasted for a few weeks.  She did not have any abdominal pain or cramping.  No rectal bleeding but described mucus in the stool.  She says that lasted for about a month and a half or so in the summer then things seemed to improve and she did have some normal bowel movements, but then recently symptoms have flared up again.  Can have up to 8 bowel movements a day.  Oftentimes will feel like she has to have a bowel movement, but she will go and nothing happens.  Not really seeing any more mucus at this point.  They have given her dicyclomine to try, so she uses that.  Labs including CBC, CMP, CRP, TSH all look good.  Fecal lactoferrin, GI pathogen panel, C. difficile were all negative.  No family history of any GI illness or IBD to her knowledge.  Mother had diverticulitis which is the only thing that she is aware of.  Has not history of GI issues prior to this.  No new medications.  Takes minimal NSAIDs.   Past Medical History:  Diagnosis Date   Asthma    Past Surgical History:  Procedure Laterality Date   ELBOW SURGERY     KNEE SURGERY Right 01/30/2015   torn meniscus and ACL   SHOULDER SURGERY     thumb surgery Left 2010    reports that she has never smoked. She has never used smokeless tobacco. She reports current alcohol use. She reports that she does not use drugs. family history includes Hypertension in her mother. Allergies  Allergen Reactions   Tape     adhesive   Tapentadol Hives    adhesive adhesive    Wound Dressing Adhesive     adhesive   Other Rash      Outpatient Encounter Medications as of 04/05/2023   Medication Sig   buPROPion (WELLBUTRIN XL) 150 MG 24 hr tablet Take 1 tablet (150 mg total) by mouth daily. Take in the morning.   ibuprofen (ADVIL) 200 MG tablet Take 200 mg by mouth every 6 (six) hours as needed.   traZODone (DESYREL) 50 MG tablet Take 0.5-1 tablets (25-50 mg total) by mouth at bedtime as needed for sleep.   Vitamin D, Ergocalciferol, (DRISDOL) 1.25 MG (50000 UNIT) CAPS capsule Take 1 capsule (50,000 Units total) by mouth every 7 (seven) days.   Vitamin D, Ergocalciferol, (DRISDOL) 1.25 MG (50000 UNIT) CAPS capsule Take 1 capsule (50,000 Units total) by mouth every 7 (seven) days.   dicyclomine (BENTYL) 10 MG capsule Take 1 capsule (10 mg total) by mouth 4 (four) times daily -  before meals and at bedtime.   meloxicam (MOBIC) 15 MG tablet Take 1 tablet (15 mg total) by mouth daily. (Patient not taking: Reported on 04/05/2023)   No facility-administered encounter medications on file as of 04/05/2023.    REVIEW OF SYSTEMS  : All other systems reviewed and negative except where noted in the History of Present Illness.   PHYSICAL EXAM: BP 118/70   Pulse 64   Ht 5\' 6"  (1.676 m)   Wt 240 lb (  108.9 kg)   BMI 38.74 kg/m  General: Well developed female in no acute distress Head: Normocephalic and atraumatic Eyes:  Sclerae anicteric, conjunctiva pink. Ears: Normal auditory acuity Lungs: Clear throughout to auscultation; no W/R/R. Heart: Regular rate and rhythm; no M/R/G. Abdomen: Soft, non-distended.  BS present.  Minimal LLQ TTP. Rectal:  Will be done at the time of colonoscopy. Musculoskeletal: Symmetrical with no gross deformities  Skin: No lesions on visible extremities Neurological: Alert oriented x 4, grossly non-focal Psychological:  Alert and cooperative. Normal mood and affect  ASSESSMENT AND PLAN: *Chronic diarrhea: Sudden onset in May.  No GI issues prior to that.  Labs including TSH and CRP are normal.  Fecal lactoferrin, C. difficile, GI pathogen panel  negative/normal.  Will plan for EGD and colonoscopy to rule out celiac disease, other small bowel enteropathy's, IBD, etc.  He is being scheduled Dr. Meridee Score.  Can use her dicyclomine and Imodium in the interim.  The risks, benefits, and alternatives to EGD and colonoscopy were discussed with the patient and she consents to proceed.   CC:  Allwardt, Crist Infante, PA-C

## 2023-04-05 NOTE — Patient Instructions (Signed)
 You have been scheduled for a colonoscopy. Please follow written instructions given to you at your visit today.   Please pick up your prep supplies at the pharmacy within the next 1-3 days.  If you use inhalers (even only as needed), please bring them with you on the day of your procedure.  DO NOT TAKE 7 DAYS PRIOR TO TEST- Trulicity (dulaglutide) Ozempic, Wegovy (semaglutide) Mounjaro (tirzepatide) Bydureon Bcise (exanatide extended release)  DO NOT TAKE 1 DAY PRIOR TO YOUR TEST Rybelsus (semaglutide) Adlyxin (lixisenatide) Victoza (liraglutide) Byetta (exanatide) ___________________________________________________________________________  _______________________________________________________  If your blood pressure at your visit was 140/90 or greater, please contact your primary care physician to follow up on this.  _______________________________________________________  If you are age 63 or older, your body mass index should be between 23-30. Your Body mass index is 38.74 kg/m. If this is out of the aforementioned range listed, please consider follow up with your Primary Care Provider.  If you are age 63 or younger, your body mass index should be between 19-25. Your Body mass index is 38.74 kg/m. If this is out of the aformentioned range listed, please consider follow up with your Primary Care Provider.   ________________________________________________________  The [facility] providers would like to encourage you to use [facility] to communicate with providers for non-urgent requests or questions.  Due to long hold times on the telephone, sending your provider a message by [facility] may be a faster and more efficient way to get a response.  Please allow 48 business hours for a response.  Please remember that this is for non-urgent requests.  _______________________________________________________

## 2023-04-05 NOTE — Progress Notes (Signed)
Attending Physician's Attestation   I have reviewed the chart.   I agree with the Advanced Practitioner's note, impression, and recommendations with any updates as below.    Emersyn Wyss Mansouraty, MD [facility]oscopy Office # [phone]  

## 2023-04-13 ENCOUNTER — Ambulatory Visit: Payer: BC Managed Care – PPO | Admitting: Physician Assistant

## 2023-04-21 ENCOUNTER — Ambulatory Visit: Payer: BC Managed Care – PPO | Admitting: Psychology

## 2023-04-26 ENCOUNTER — Ambulatory Visit: Payer: BC Managed Care – PPO | Admitting: Psychology

## 2023-04-27 ENCOUNTER — Ambulatory Visit: Payer: BC Managed Care – PPO | Admitting: Psychology

## 2023-04-27 DIAGNOSIS — F89 Unspecified disorder of psychological development: Secondary | ICD-10-CM

## 2023-04-27 NOTE — Progress Notes (Addendum)
Date: 04/27/2023 Appointment Start Time: 9am Duration: 105 minutes Provider: Helmut Muster, PsyD Type of Session: Initial Appointment for Evaluation  Location of Patient: Home Location of Provider: Provider's Home (private office) Type of Contact: Caregility video visit with audio  Session Content:  Prior to proceeding with today's appointment, two pieces of identifying information were obtained from [place] to verify identity. In addition, Pete's physical location at the time of this appointment was obtained. In the event of technical difficulties, Tyshawna shared a phone number she could be reached at. Earney Mallet and this provider participated in today's telepsychological service. Kaithlyn denied anyone else being present in the room or on the virtual appointment.  The provider's role was explained to [place]. The provider reviewed and discussed issues of confidentiality, privacy, and limits therein (e.g., reporting obligations). In addition to verbal informed consent, written informed consent for psychological services was obtained from [place] prior to the initial appointment. Written consent included information concerning the practice, financial arrangements, and confidentiality and patients' rights. Since the clinic is not a 24/7 crisis center, mental health emergency resources were shared, and the provider explained e-mail, voicemail, and/or other messaging systems should be utilized only for non-emergency reasons. This provider also explained that information obtained during appointments will be placed in their electronic medical record in a confidential manner. Jiaqi verbally acknowledged understanding of the aforementioned and agreed to use mental health emergency resources discussed if needed. Moreover, Koralyn agreed information may be shared with other [place] or their referring provider(s) as needed for coordination of care. By signing the new patient documents, Clint provided written  consent for coordination of care. Shany verbally acknowledged understanding she is ultimately responsible for understanding her insurance benefits as it relates to reimbursement of telepsychological and in-person services. This provider also reviewed confidentiality, as it relates to telepsychological services, as well as the rationale for telepsychological services. This provider further explained that video should not be captured, photos should not be taken, nor should testing stimuli be copied or recorded as it would be a copyright violation. Swan expressed understanding of the aforementioned, and verbally consented to proceed. Shunita is aware of the limitations of teleheath visits and verbally consented to proceed.  Tuwana completed the neurobehavioral examination, which included obtaining a, family, social, and psychiatric history; integration of prior history and other sources of clinical data to assist with clinical decision making; behavioral observations; assessment of thinking, reasoning, and judgment; and establishment of a provisional diagnosis. The evaluation was completed in 105 minutes. Codes 78295 and [id number] were billed.   Mental Status Examination:  Appearance:  neat Behavior: appropriate to circumstances Mood: neutral Affect: mood congruent Speech: WNL Eye Contact: appropriate Psychomotor Activity: restless Thought Process: denies suicidal, homicidal, and self-harm ideation, plan and intent Content/Perceptual Disturbances: none Orientation: AAOx4 Cognition/Sensorium: intact Insight: good Judgment: good  Provisional DSM-5 diagnosis(es):  F89 Unspecified Disorder of Psychological Development   Plan: Testing is expected to answer the question, does the individual meet criteria for ADHD when age, other mental health concerns (e.g., depression, anxiety, and OCD), and cognitive functioning are taken into consideration? Further testing is warranted because a diagnosis cannot be  given solely based on current interview data (further data is required). Testing results are expected to answer the remaining diagnostic questions in order to provide an accurate diagnosis and assist in treatment planning with an expectation of improved clinical outcome. Rashetta is currently scheduled for an appointment on 05/06/2023 at 7am via Caregility video visit with audio.  CONFIDENTIAL PSYCHOLOGICAL EVALUATION ______________________________________________________________________________  Name: Reinaldo Meeker   Date of Birth: 1995-06-25    Age: 27  SOURCE AND REASON FOR REFERRAL: Ms. Nichoel Coan was referred by Ila Mcgill, PA-C, for an evaluation to ascertain if she meets the criteria for Attention Deficit/Hyperactivity Disorder (ADHD).    BACKGROUND INFORMATION AND PRESENTING PROBLEM: Ms. Zabelle Loisel is a 27 year old female who resides in [place].  Ms. Ell reported her grandmother recently passed away, which she described as "fairly traumatic." She also noted she has been experiencing "anxiety and depression," which led to her speaking with her "PCP" about her experiences. She shared that her PCP asked if she "had ever been tested for ADHD" and recommended she pursue an evaluation, adding that a mental health professional she met with as a child also asked her this, but an ADHD evaluation was never pursued. She expressed a belief that her current medical provider and past mental health professional were noticing that she "can't sit still," is "very jittery," and "sidetracked easily."   Symptoms Frequency  Often Occasionally Infrequent/No Significant Issues Inattention Criterion (DSM-5-TR)    Making careless mistakes and missing small details.  She indicated being prone to forgetting details of a task and making mistakes unless the information is "written down" in a place where she can continue to reference it. This contributes to her checking her work multiple  times and needing an extended time to complete tasks.    Being easily distracted by various stimuli and the mind often being elsewhere even when no apparent distractions exist.  She noted that she is easily distracted by various stimuli (e.g., other tasks, visuals, irrelevant thoughts, and sounds) and that her mind is often elsewhere even when there are no obvious distractions. She described how this contributes to task completion issues.    Trouble sustaining attention during conversations.  She indicated this difficulty regularly occurs.    Does not follow through on instructions and fails to finish tasks.  She shared that she regularly waits until the "last minute" to start tasks, noting how this can lead to stress as she tries to complete the task before its deadline. She also shared she is prone to forgetting to return phone calls, "parts" of functions, and to bring needed items for tasks.    Difficulty organizing tasks and activities.  She stated that her environment commonly has clutter, and she has trouble prioritizing, planning, and determining the required steps to complete large tasks. She said it is "easier when [she] can go with the flow."   Avoids, dislikes, or is reluctant to engage in tasks that require sustained mental effort. She described tending to put off tasks until the "last minute," and that urgency is a primary motivator for starting tasks that require sustained mental effort. She further described how inattention contributes to this difficulty.    Loses things necessary for tasks or activities.  She stated she frequently misplaces needed items.    Forgetful in daily activities.  She discussed commonly being forgetful (e.g., what she wanted to say, needed items for tasks, and to return phone calls).    Hyperactivity and Impulsivity Criterion (DSM-5-TR)    Fidgets with hands or feet or squirms in seat. She shared that she habitually and largely unconsciously fidgets (e.g., plays  with items around her, bounces her legs, stands up, and "walks around").    Leaves seat in situations in which remaining seated is expected. She noted she is prone to leaving her seat when she  is expected to stay seated, which has led to others commenting on it.   Feelings of restlessness. She described frequently feeling restless, which contributes to difficulty relaxing and sleep onset issues.   Difficulty playing or engaging in leisure activities quietly. She stated she tends to talk in situations where she is expected to stay quiet as she has an urge to share "thoughts," which others have commented on.    "On the go" or often acts as if "driven by a motor." She shared that she often feels restless and must be moving or doing something.    Talks excessively.  She stated that she " wakes [s] up ready to talk" and that she tends to talk for extended periods due to a "desire to get the information out," which others have occasionally commented on.   Interrupts others.  She stated she is prone to interrupting others because she "thought they were done talking" and/or because she is concerned she will forget what she wants to say, which others have commented on.   Impatience.    She denied experiencing significant issues with this symptom. ADHD-Related Symptoms that Assist in Identifying ADHD but are not in the DSM-5-TR Laureen Ochs, 2021, p. 6-12 and [phone])    Emotional dysregulation and overstimulation. She reported she is prone to agitation and feeling overwhelmed if multiple people are talking to her at one time.   Making decisions impulsively. She described a tendency to make purchases she later regrets, which contributes to her regularly having to return items.   Tending to drive much faster than others.   She denied experiencing significant issues with this symptom. Trouble following through on promises or commitments made to others.  She stated she tends to wait until near the deadline to follow  through on her promises and commitments to others.    Trouble doing things in proper order or sequence.   She denied experiencing significant issues with this symptom.  Ms. Bisel discussed a history of depressive episodes that include low mood, anhedonia, lack of drive, psychomotor slowing, and exacerbated concentration issues; generalized anxiety that includes concerns of abandonment "all the time" and can be difficult to manage; obsessions and compulsions (e.g., experiencing distress if she is unable to do something at an even number, a fear of germs, and a strong dislike of having "dirt" on her); sleep onset and maintenance issues that she indicated restlessness contributes to and she utilizes Trazadone to assist with; and alternating between inadequate daily caloric intake and overeating-related behaviors (e.g., eating until uncomfortably full), which she attributed to not eating consistently and then "eating a big meal to make up for the missed portion. She expressed a belief that her ADHD-related symptoms are consistent and independent of mood. She stated her coping and compensatory strategies include utilizing reminders, double- and triple-checking her work, and determining the time she needs to leave for a destination to prevent stress and "rushing."  Ms. Shumard denied awareness of having ever experienced any developmental milestone delays, grade retention, learning disability diagnosis, or having an individualized education plan. She discussed that throughout primary and secondary school, she obtained "As and Bs" and "maybe one C." However, she noted she struggled to start and complete assignments; was prone to forgetting to complete assignments; and regularly talked, "wouldn't sit still," and was inattentive during class, which her teachers commented on. She described her desire to play basketball as a motivator to prioritize her academic functioning, as she had to maintain a certain GPA to play  basketball. She discussed that her inattention and task initiation issues were more noticeable in college and negatively impacted her academic performance. She further discussed how these difficulties worsened after she entered into a "depression state" because she "tore [her] knee" and was unable to play basketball. She indicated she switched colleges after having spent three years at one and that she obtained an associate degree. She shared that her employment disciplinary history is significant for receiving comments about her difficulty staying seated when she is expected to.    Ms. Arnet reported her medical history is significant for "stomach issues" and that she is currently undergoing "tests" to determine the etiology. She also reported experiencing two concussions, with one occurring while she was in middle school and the other while she was in high school, adding that she was "cleared" by a medical provider and is unaware of experiencing any lingering effects from them. Ms. Dasilva discussed utilizing therapy as a child secondary to her "father not being present or sticking to promises" and recent use for "anxiety," "depression," and "sleep issues." She further discussed that she currently utilizes Trazadone and Wellbutrin for "anxiety," "depression," and "sleep issues," but that they are limited in their efficacy. She reported using one-to-two standard-size beers or six shots three to five times a month, using a "Black and Mild" approximately three times a week, and using tea, soda, or an occasional Celsius energy drink when tired. She denied the use of all other recreational and illicit substances. She also denied ever experiencing psychiatric hospitalization or meeting full criteria for hypomanic or manic episode; psychosis; suicidal or homicidal ideation, plan, or intent; or legal involvement. She stated her familial mental health history is significant for depression (mother) and possible ADHD as  they are inattentive, "always on the go," and "ha[ve] to be doing something (cousin).   Chart Review: Per a 03/02/2023 appointment note, Alyssa Allwardt PA-C reported Ms. Brandes has a history of "anxiety and depression," "vitamin D deficiency," "inattention," and "other fatigue." She indicated Ms. Gaccione is currently prescribed bupropion HCl ER for anxiety, depression, and inattention and was referred to psychiatric and "psychology" services as well as for an ADHD evaluation.  The chart review indicates she meets with Forde Radon, [facility], for psychotherapeutic services. On an 03/21/2023 appointment note, Ms. Yates reported, "[Ms. Koike] endorsed depressive symptoms and anxiety symptoms that consistent w/ depressive episode for the past 1-2 months.  Pt has been struggling w/ digestive issues of unknown origin that causing stress, work stress, unresolved feelings of dad's absence in her life and strain w/ her relationship not being accepted by mom.  Pt is seeking counseling to assist coping."              Margarite Gouge, PsyD

## 2023-05-03 ENCOUNTER — Ambulatory Visit: Payer: BC Managed Care – PPO | Admitting: Psychology

## 2023-05-06 ENCOUNTER — Ambulatory Visit: Payer: BC Managed Care – PPO | Admitting: Psychology

## 2023-05-06 DIAGNOSIS — F89 Unspecified disorder of psychological development: Secondary | ICD-10-CM

## 2023-05-06 NOTE — Progress Notes (Signed)
Date: 05/06/2023   Appointment Start Time: 7:03am (pt arrived late to the appt) Duration: 55 minutes Provider: Helmut Muster, PsyD Type of Session: Testing Appointment for Evaluation  Location of Patient: Home Location of Provider: Provider's Home (private office) Type of Contact: Caregility video visit with audio  Session Content: Today's appointment was a telepsychological visit. Sandy Ryan is aware it is her responsibility to secure confidentiality on her end of the session. Prior to proceeding with today's appointment, Sandy Ryan's physical location at the time of this appointment was obtained as well a phone number she could be reached at in the event of technical difficulties. Sandy Ryan denied anyone else being present in the room or on the virtual appointment. This provider reviewed that video should not be captured, photos should not be taken, nor should testing stimuli be copied or recorded as it would be a copyright violation. Sandy Ryan expressed understanding of the aforementioned, and verbally consented to proceed. The WAIS-IV was administered, scored, and interpreted by this evaluator. Sandy Ryan is aware of the limitations of teleheath visits and verbally consented to proceed.  Billing codes will be input on the feedback appointment. There are no billing codes for the testing appointment.   Provisional DSM-5 diagnosis(es):  F89 Unspecified Disorder of Psychological Development   Plan: Sandy Ryan was scheduled for a feedback appointment on 05/12/2023 at 2pm via Caregility video visit with audio.                Margarite Gouge, PsyD

## 2023-05-10 ENCOUNTER — Ambulatory Visit (INDEPENDENT_AMBULATORY_CARE_PROVIDER_SITE_OTHER): Payer: BC Managed Care – PPO | Admitting: Physician Assistant

## 2023-05-10 VITALS — BP 118/76 | HR 85 | Temp 97.1°F | Ht 66.0 in | Wt 241.6 lb

## 2023-05-10 DIAGNOSIS — M25511 Pain in right shoulder: Secondary | ICD-10-CM

## 2023-05-10 MED ORDER — NAPROXEN 500 MG PO TABS
500.0000 mg | ORAL_TABLET | Freq: Two times a day (BID) | ORAL | 0 refills | Status: AC
Start: 2023-05-10 — End: 2023-05-20

## 2023-05-10 MED ORDER — BACLOFEN 10 MG PO TABS
10.0000 mg | ORAL_TABLET | Freq: Two times a day (BID) | ORAL | 0 refills | Status: DC
Start: 2023-05-10 — End: 2023-07-10

## 2023-05-10 NOTE — Progress Notes (Signed)
Patient ID: Sandy Ryan, female    DOB: April 23, 1996, 27 y.o.   MRN: [id number]   Assessment & Plan:  Acute pain of right shoulder -     Naproxen; Take 1 tablet (500 mg total) by mouth 2 (two) times daily with a meal for 10 days.  Dispense: 20 tablet; Refill: 0 -     Baclofen; Take 1 tablet (10 mg total) by mouth 2 (two) times daily. Take for muscle relaxation.  Dispense: 30 each; Refill: 0    Assessment and Plan    Right Shoulder Pain Pain localized to the right shoulder and chest area, possibly radiating down the arm. No associated rash, shingles, or recent HS flare-ups. No mass palpated on exam. Likely musculoskeletal or nerve irritation. -Start Naproxen twice daily for 10 days, take with food. -Start Baclofen as a muscle relaxer, can take twice daily as needed. -Consider massage, icing, and use of a tennis ball for self-massage. -If no improvement, consider imaging or consultation with Dr. Denyse Amass.  Gastrointestinal Health Patient has scheduled colonoscopy and endoscopy with gastroenterologist. -No specific plan discussed in this conversation.         No follow-ups on file.    Subjective:    Chief Complaint  Patient presents with   Arm Pain    Pt in office c/o under arm pain; RUE pain that sometimes feels muscle related and others nerve pain, sitting position for to long cause pain. Has discussed with PCP at last visit pt states not getting better    HPI Discussed the use of AI scribe software for clinical note transcription with the patient, who gave verbal consent to proceed.  History of Present Illness   The patient presents with right-sided chest and arm pain that has been ongoing for the past two to three months. The pain is described as being located in the right chest area, radiating down to the wrist at times. The pain is constant and is not relieved by removing tight clothing such as a bra. The patient has tried adjusting her posture and using a rolled  blanket for support, but the pain persists. The patient also reports a numbing sensation in the affected area. The patient has a history of HS, but reports no recent flare-ups. The patient has previously undergone surgery on the right shoulder and experiences occasional shoulder pain.       Past Medical History:  Diagnosis Date   Asthma     Past Surgical History:  Procedure Laterality Date   ELBOW SURGERY     KNEE SURGERY Right 01/30/2015   torn meniscus and ACL   SHOULDER SURGERY     thumb surgery Left 2010    Family History  Problem Relation Age of Onset   Hypertension Mother    Colon cancer Neg Hx    Esophageal cancer Neg Hx    Stomach cancer Neg Hx     Social History   Tobacco Use   Smoking status: Never   Smokeless tobacco: Never  Vaping Use   Vaping status: Never Used  Substance Use Topics   Alcohol use: Yes    Comment: socially   Drug use: No     Allergies  Allergen Reactions   Tape     adhesive   Tapentadol Hives    adhesive adhesive    Wound Dressing Adhesive     adhesive   Other Rash    Review of Systems NEGATIVE UNLESS OTHERWISE INDICATED IN HPI  Objective:     BP 118/76 (BP Location: Left Arm, Patient Position: Sitting, Cuff Size: Large)   Pulse 85   Temp (!) 97.1 F (36.2 C) (Temporal)   Ht 5\' 6"  (1.676 m)   Wt 241 lb 9.6 oz (109.6 kg)   SpO2 97%   BMI 39.00 kg/m   Wt Readings from Last 3 Encounters:  05/10/23 241 lb 9.6 oz (109.6 kg)  04/05/23 240 lb (108.9 kg)  03/02/23 236 lb 3.2 oz (107.1 kg)    BP Readings from Last 3 Encounters:  05/10/23 118/76  04/05/23 118/70  03/02/23 126/86     Physical Exam Vitals and nursing note reviewed.  Constitutional:      Appearance: Normal appearance.  Eyes:     Extraocular Movements: Extraocular movements intact.     Conjunctiva/sclera: Conjunctivae normal.     Pupils: Pupils are equal, round, and reactive to light.  Chest:     Chest wall: No mass, swelling or tenderness.   Breasts:    Right: Normal. No mass.     Left: Normal. No mass.  Musculoskeletal:        General: Tenderness (posterior shoulder TTP) present. No swelling, deformity or signs of injury.  Lymphadenopathy:     Upper Body:     Right upper body: No supraclavicular, axillary or pectoral adenopathy.     Left upper body: No supraclavicular, axillary or pectoral adenopathy.  Skin:    Findings: No lesion or rash.  Neurological:     Mental Status: She is alert.     Sensory: No sensory deficit.  Psychiatric:        Mood and Affect: Mood normal.        Behavior: Behavior normal.          Thinh Cuccaro M Sherill Mangen, PA-C

## 2023-05-12 ENCOUNTER — Ambulatory Visit: Payer: BC Managed Care – PPO | Admitting: Psychology

## 2023-05-12 DIAGNOSIS — F411 Generalized anxiety disorder: Secondary | ICD-10-CM

## 2023-05-12 DIAGNOSIS — F902 Attention-deficit hyperactivity disorder, combined type: Secondary | ICD-10-CM

## 2023-05-12 DIAGNOSIS — F331 Major depressive disorder, recurrent, moderate: Secondary | ICD-10-CM | POA: Diagnosis not present

## 2023-05-12 DIAGNOSIS — F909 Attention-deficit hyperactivity disorder, unspecified type: Secondary | ICD-10-CM | POA: Insufficient documentation

## 2023-05-12 HISTORY — DX: Attention-deficit hyperactivity disorder, unspecified type: F90.9

## 2023-05-12 NOTE — Progress Notes (Signed)
Testing and Report Writing Information: The following measures  were administered, scored, and interpreted by this provider:  Generalized Anxiety Disorder-7 (GAD-7; 5 minutes), Patient Health Questionnaire-9 (PHQ-9; 5 minutes), Wechsler Adult Intelligence Scale-Fourth Edition (WAIS-IV; 70 minutes), CNS Vital Signs (45 minutes), Adult Attention Deficit/Hyperactivity Disorder Self-Report Scale Checklist (ASRSv1.1; 15 minutes), Behavior Rating Inventory for Executive Function - A - Self Report (BRIEF A; 10 minutes) and Behavior Rating Inventory for Executive Function - A - Informant (BRIEF-A; 10 minutes), Adult OCD Inventory (OCD-A) SF-20 (20 minutes), Personality Assessment Inventory (PAI; 50 minutes). A total of 230 minutes was spent on the administration and scoring of the aforementioned measures. Codes 78295 and [phone] (7 units) were billed.  Please see the assessment for additional details. This provider completed the written report which includes integration of patient data, interpretation of standardized test results, interpretation of clinical data, review of information provided by [facility] and any collateral information/documentation, and clinical decision making (250 minutes in total).  Feedback Appointment: Date: 05/12/2023 Appointment Start Time: 2pm Duration: 40 minutes Provider: Helmut Muster, PsyD Type of Session: Feedback Appointment for Evaluation  Location of Patient: Home Location of Provider: Provider's Home (private office) Type of Contact: Caregility video visit with audio  Session Content: Today's appointment was a telepsychological visit due to COVID-19. Destiny is aware it is her responsibility to secure confidentiality on her end of the session. She provided verbal consent to proceed with today's appointment. Prior to proceeding with today's appointment, Charnell's physical location at the time of this appointment was obtained as well a phone number she could be reached at in the  event of technical difficulties. Melonia denied anyone else being present in the room or on the virtual appointment. Zeina is aware of the limitations of teleheath visits and verbally consented to proceed.  This provider and Earney Mallet completed the interactive feedback session which includes reviewing the aforementioned measures, treatment recommendations, and diagnostic conclusions.  40 The interactive feedback session was completed today and a total of 40 minutes was spent on feedback. Code 86578 was billed for feedback session.   DSM-5 Diagnosis(es):  F90.2 Attention-Deficit/Hyperactivity Disorder, Combined Presentation, Moderate F33.1 Major Depressive Disorder, recurrent, moderate F41.1 Generalized Anxiety Disorder   Time Requirements: Assessment scoring and interpreting: 230 minutes (billing code 46962 and [phone] [7 units]) Feedback: 40 minutes (billing code 13244) Report writing: 250 total minutes. 04/22/2023: 9:55-10:15am (inputting chart review information into the evaluation). 04/28/23: 2:25-3pm. 04/29/2023: 7:15-8pm. 05/04/2023: 6:50-7:35pm. 05/06/2023: 8:20-9am. 05/06/2023: 6:30-6:45pm and 7:05-7:50pm. 05/12/2023: 1:40-1:45pm.   (billing code [id number] [4 units])  Plan: Earney Mallet provided verbal consent for her evaluation to be sent via e-mail. No further follow-up planned by this provider.       CONFIDENTIAL PSYCHOLOGICAL EVALUATION ______________________________________________________________________________  Name: Sandy Ryan   Date of Birth: 21-Oct-1995    Age: 27 Dates of Evaluation: 04/27/2023, 04/29/2023, 05/02/2023, and 05/06/2023  SOURCE AND REASON FOR REFERRAL: Alyssa Allwardt, PA-C, referred [name] for an evaluation to determine whether she meets the criteria for Attention Deficit/Hyperactivity Disorder (ADHD).   EVALUATIVE PROCEDURES: Clinical Interview with Ms. Earney Mallet Phillipson (04/27/2023) Wechsler Adult Intelligence Scale-Fourth Edition (WAIS-IV;  05/06/2023) CNS Vital Signs (05/02/2023) Adult Attention Deficit/Hyperactivity Disorder Self-Report Scale Checklist (05/02/2023) Behavior Rating Inventory for Executive Function - A - Self Report Behavior Rating Inventory for Executive Function - 2A - Self Report (BRIEF-2A; 04/29/2023) and Informant (04/29/2023) Personality Assessment Inventory (PAI; 05/02/2023) Patient Health Questionnaire-9 (PHQ-9) Generalized Anxiety Disorder-7 (GAD-7) Adult OCD Inventory (OCD-A) SF-20 (05/02/2023)   BACKGROUND INFORMATION AND PRESENTING PROBLEM:  Sandy Ryan is a 27 year old female who resides in [place].  Ms. Mcloud reported her grandmother recently passed away, which she described as "fairly traumatic." She also noted she has been experiencing "anxiety and depression," which led to her speaking with her "PCP" about her experiences. She shared that her PCP asked if she "had ever been tested for ADHD." She recommended she pursue an evaluation, adding that a mental health professional she met with as a child also asked her this, but an ADHD evaluation was never pursued. She expressed a belief that her current medical provider and past mental health professional were noticing that she "can't sit still," is "very jittery," and "sidetracked easily."   Symptoms Frequency  Often Occasionally Infrequent/No Significant Issues Inattention Criterion (DSM-5-TR)    Making careless mistakes and missing small details.  She indicated being prone to forgetting details of a task and making mistakes unless the information is "written down" in a place where she can continue to reference it. This contributes to her checking her work multiple times and needing an extended time to complete tasks.    Being easily distracted by various stimuli and the mind often being elsewhere even when no apparent distractions exist.  She noted that she is easily distracted by various stimuli (e.g., other tasks, visuals, irrelevant  thoughts, and sounds) and that her mind is often elsewhere even when there are no obvious distractions. She described how this contributes to task completion issues.    Trouble sustaining attention during conversations.  She indicated this difficulty regularly occurs.    Does not follow through on instructions and fails to finish tasks.  She shared that she regularly waits until the "last minute" to start tasks, noting how this can lead to stress as she tries to complete the task before its deadline. She also shared she is prone to forgetting to return phone calls, "parts" of functions, and to bring needed items for tasks.    Difficulty organizing tasks and activities.  She stated that her environment commonly has clutter, and she has trouble prioritizing, planning, and determining the required steps to complete large tasks. She said it is "easier when [she] can go with the flow."   Avoids, dislikes, or is reluctant to engage in tasks that require sustained mental effort. She described tending to put off tasks until the "last minute," and that urgency is a primary motivator for starting tasks that require sustained mental effort. She further described how inattention contributes to this difficulty.    Loses things necessary for tasks or activities.  She stated she frequently misplaces needed items.    Forgetful in daily activities.  She discussed commonly being forgetful (e.g., what she wanted to say, needed items for tasks, and to return phone calls).    Hyperactivity and Impulsivity Criterion (DSM-5-TR)    Fidgets with hands or feet or squirms in seat. She shared that she habitually and largely unconsciously fidgets (e.g., plays with items around her, bounces her legs, stands up, and "walks around").    Leaves seat in situations in which remaining seated is expected. She noted she is prone to leaving her seat when she is expected to stay seated, which has led to others commenting on it.   Feelings of  restlessness. She described frequently feeling restless, which contributes to difficulty relaxing and sleep onset issues.   Difficulty playing or engaging in leisure activities quietly. She stated she tends to talk in situations where she is expected to stay quiet  as she has an urge to share "thoughts," which others have commented on.    "On the go" or often acts as if "driven by a motor." She shared that she often feels restless and must be moving or doing something.    Talks excessively.  She stated that she " wakes [s] up ready to talk" and that she tends to talk for extended periods due to a "desire to get the information out," which others have occasionally commented on.   Interrupts others.  She stated she is prone to interrupting others because she "thought they were done talking" and/or because she is concerned she will forget what she wants to say, which others have commented on.   Impatience.    She denied experiencing significant issues with this symptom. ADHD-Related Symptoms that Assist in Identifying ADHD but are not in the DSM-5-TR Laureen Ochs, 2021, p. 6-12 and [phone])    Emotional dysregulation and overstimulation. She reported she is prone to agitation and feeling overwhelmed if multiple people are talking to her at one time.   Making decisions impulsively. She described a tendency to make purchases she later regrets, which contributes to her regularly having to return items.   Tending to drive much faster than others.   She denied experiencing significant issues with this symptom. Trouble following through on promises or commitments made to others.  She stated she tends to wait until near the deadline to follow through on her promises and commitments to others.    Trouble doing things in proper order or sequence.   She denied experiencing significant issues with this symptom.  Ms. Granade discussed a history of depressive episodes that include low mood, anhedonia, lack of drive,  psychomotor slowing, and exacerbated concentration issues; generalized anxiety that includes concerns of abandonment "all the time" and can be difficult to manage; obsessions and compulsions (e.g., experiencing distress if she is unable to do something at an even number, a fear of germs, and a strong dislike of having "dirt" on her); sleep onset and maintenance issues that she indicated restlessness contributes to and she utilizes Trazadone to assist with; and alternating between inadequate daily caloric intake and overeating-related behaviors (e.g., eating until uncomfortably full), which she attributed to not eating consistently and then "eating a big meal to make up for the missed portion. She expressed a belief that her ADHD-related symptoms are consistent and independent of mood. She stated her coping and compensatory strategies include utilizing reminders, double- and triple-checking her work, and determining the time she needs to leave for a destination to prevent stress and "rushing."  Ms. Sayson denied awareness of having ever experienced any developmental milestone delays, grade retention, learning disability diagnosis, or having an individualized education plan. She discussed that throughout primary and secondary school, she obtained "As and Bs" and "maybe one C." However, she noted she struggled to start and complete assignments; was prone to forgetting to complete tasks; and regularly talked, "wouldn't sit still," and was inattentive during class, which her teachers commented on. She described her desire to play basketball as a motivator to prioritize her academic functioning, as she had to maintain a certain GPA to play basketball. She discussed that her inattention and task initiation issues were more noticeable in college and negatively impacted her academic performance. She further discussed how these difficulties worsened after she entered into a "depression state" because she "tore [her] knee"  and was unable to play basketball. She indicated she switched colleges after having spent three years  at one and that she obtained an associate degree. She shared that her employment disciplinary history is significant for receiving comments about her difficulty staying seated when she is expected to.    Ms. Sudhoff reported her medical history is significant for "stomach issues" and that she is currently undergoing "tests" to determine the etiology. She also reported experiencing two concussions, with one occurring while she was in middle school and the other while she was in high school, adding that she was "cleared" by a medical provider and is unaware of experiencing any lingering effects from them. Ms. Iseri discussed utilizing therapy as a child secondary to her "father not being present or sticking to promises" and recent use for "anxiety," "depression," and "sleep issues." She further discussed that she currently utilizes Trazadone and Wellbutrin for "anxiety," "depression," and "sleep issues," but that they are limited in their efficacy. She reported using one-to-two standard-size beers or six shots three to five times a month, using a "Black and Mild" approximately three times a week, and using tea, soda, or an occasional Celsius energy drink when tired. She denied the use of all other recreational and illicit substances. She also denied ever experiencing psychiatric hospitalization or meeting full criteria for hypomanic or manic episodes; trauma- and stressor-related disorder; psychosis; suicidal or homicidal ideation, plan, or intent; or legal involvement. She stated her familial mental health history is significant for depression (mother) and possible ADHD as they are inattentive, "always on the go," and "ha[ve] to be doing something (cousin).   Chart Review: Per a 03/02/2023 appointment note, Alyssa Allwardt PA-C reported Ms. Bonn has a history of "anxiety and depression," "vitamin D  deficiency," "inattention," and "other fatigue." She indicated Ms. Richoux is currently prescribed bupropion HCl ER for anxiety, depression, and inattention and was referred to psychiatric and "psychology" services as well as for an ADHD evaluation.  The chart review indicates she meets with Forde Radon, [facility], for psychotherapeutic services. On an 03/21/2023 appointment note, Ms. Yates reported, "[Ms. Pautler] endorsed depressive symptoms and anxiety symptoms that consistent w/ depressive episode for the past 1-2 months.  Pt has been struggling w/ digestive issues of unknown origin that causing stress, work stress, unresolved feelings of dad's absence in her life and strain w/ her relationship not being accepted by mom.  Pt is seeking counseling to assist coping."  BEHAVIORAL OBSERVATIONS: Ms. Fialkowski presented on time for the evaluation. She was well-groomed. She was oriented to the time, place, person, and purpose of the appointment. During the interview and assessment, she appeared restless and often moved in her seat. Throughout the evaluation, she maintained appropriate eye contact. Her thought processes and content were logical, coherent, and goal-directed. There were no overt signs of a thought disorder or perceptual disturbances, nor did she report such symptomatology. There was no evidence of paraphasias (i.e., errors in speech, gross mispronunciations, and word substitutions), repetition deficits, or disturbances in volume or prosody (i.e., rhythm and intonation). Based on Ms. Salomone's approach to testing, the current results are believed to be a fair estimate of her abilities.  PROCEDURAL CONSIDERATIONS:  Psychological testing measures were conducted through a virtual visit with video and audio capabilities, but otherwise in a standard manner.   The Wechsler Adult Intelligence Scale, Fourth Edition (WAIS-IV) was administered via remote telepractice using digital stimulus materials on [employer]'s  Q-global system. The remote testing environment appeared free of distractions, adequate rapport was established with the examinee via video/audio capabilities, and Ms. Ullman appeared appropriately engaged in the  task throughout the session. No significant technological problems or distractions were noted during administration. Modifications to the standardization procedure included: none. The WAIS-IV subtests, or similar tasks, have received initial validation in several samples for remote telepractice and digital format administration, and the results are considered a valid description of Ms. Kington's skills and abilities.  CLINICAL FINDINGS:  COGNITIVE FUNCTIONING  Wechsler Adult Intelligence Scale, Fourth Edition (WAIS-IV):  Ms. Macdermott completed subtests of the WAIS-IV, a full-scale measure of cognitive ability. The WAIS-IV comprises four indices that measure cognitive processes that are components of intellectual ability; however, only subtests from the Verbal Comprehension and Working Memory indices were administered. As a result, Full-Scale-IQ (FSIQ) and General Ability Index (GAI) could not be determined.   WAIS-IV Scale/Subtest IQ/Scaled Score 95% Confidence Interval Percentile Rank Qualitative Description Verbal Comprehension (VCI) 76 71-83 5 Borderline Similarities 6    Vocabulary 6    Information 5    Working Memory (WMI) 97 90-104 42 Average Digit Span 10    Arithmetic 9      The Verbal Comprehension Index (VCI) measures one's ability to receive, comprehend, and express language. It also measures the ability to retrieve previously learned information and understanding the relationships between words and concepts presented orally. Ms. Linz obtained a VCI score of 76 (5th percentile), placing her in the borderline range compared to her same-aged peers. Her performance on the subtests comprising this index was comparable, which suggests that the various verbal cognitive abilities  measured by these subtests are similarly developed.   The Working Memory Index (WMI) measures one's ability to sustain attention, concentrate, and exert mental control. Ms. Isidoro obtained a WMI score of 97 (42nd percentile), placing her in the average range compared to her same-aged peers. She demonstrated similar performance on the subtests comprising this index. The 21-point difference between the VCI and WMI scores is statistically significant at the .05 level, suggesting that her ability to sustain attention, concentrate, and exert mental control is better developed than her verbal reasoning abilities. Upon follow-up Ms. Grissom stated it was a "struggle" to sustain her attention throughout the measure.   ATTENTION AND PROCESSING  CNS Vital Signs: The CNS Vital Signs assessment evaluates the neurocognitive status of an individual and covers a range of mental processes. The results of the CNS Vital Signs testing indicated average neurocognitive processing ability. Her attentional abilities were comparable, with complex attention and simple attention in the average range and sustained attention in the above range. Executive function and cognitive flexibility were average. Working memory was above. Psychomotor speed, motor speed, processing speed, and reaction time were average, which indicates average hand-eye coordination, thinking speed, and responsiveness. Visual memory (images) was above, and verbal memory (words) was low average, which indicates visual memory is a relative strength. The results suggest Ms. Killion experiences a weakness in verbal memory and strengths in visual memory, working memory, and sustained attention. Upon follow-up, Ms. Cofer described herself as "more of a visual learner" as well as noted she placed importance on her performance on the measures, which she indicated was helpful in sustaining her attention for the required times. However, she shared that sustaining her attention  was a "struggle" and she felt "jittery" after.   Domain  Standard Score Percentile Validity Indicator Guideline Neurocognitive Index 100 50 Yes Average Composite Memory 105 63 Yes Average Verbal Memory 89 23 Yes Low Average Visual Memory 116 86 Yes Above Psychomotor Speed 103 58 Yes Average Reaction Time 99 47 Yes Average Complex Attention  98 45 Yes Average Cognitive Flexibility 94 34 Yes Average Processing Speed  97 42 Yes Average Executive Function 96 40 Yes Average Working Memory 116 86 Yes Above Sustained Attention 111 77 Yes Above Simple Attention 108 70 Yes Average Motor Speed 107 68 Yes Average  EXECUTIVE FUNCTION Behavior Rating Inventory of Executive Function, Second Edition Event organiser) Self-Report:  Ms. Schenk completed the Self-Report Form of the Behavior Rating Inventory of Executive Function-Adult Version, Second Edition [employer]), which has three domains that evaluate cognitive, behavioral, and emotional regulation, and a Global Executive Composite score provides an overall snapshot of executive functioning. There are no missing item responses in the protocol.  The Negativity, Infrequency, and Inconsistency scales are not elevated, suggesting she did not respond to the protocol in an overly negative, haphazard, extreme, or inconsistent manner. In the context of these validity considerations, Ms. Stine everyday executive function ratings suggest some areas of concern. The overall index score, the GEC, was moderately elevated (GEC T = 70, %ile = 95). The Behavior Regulation Index (BRI), Emotion Regulation Index (ERI), and Cognitive Regulation Index (CRI) scores were all elevated (BRI T = 68, %ile = 95; ERI T = 71, %ile = 95, CRI T = 69, %ile = 93), suggesting self-regulatory problems in multiple domains. Ms. Janosko indicated difficulty resisting impulses, reacting to events appropriately, getting going on tasks and activities, independently generating ideas, and sustaining working  memory. She did not describe her ability to be aware of her functioning in social settings, adjust well to changes, plan and organize her approach to problem-solving appropriately, be appropriately cautious in her approach to tasks and check for mistakes, and keep materials and belongings reasonably well-organized as problematic. However, the Self-Monitor and Organization of Materials scales approached an abnormal elevation. The elevated scores on scales reflecting problems with fundamental behavioral and/or emotional regulation (Inhibit, Emotional Control, and Shift) suggest that more global problems with self-regulation negatively affect active cognitive problem-solving (elevated CRI).  Scale/Index  Raw Score T Score Percentile Qualitative Description Inhibit 17 70 97 Moderately Elevated Self-Monitor 11 64 91 Approaching an Elevation Behavior Regulation Index (BRI) 28 68 95 Mildly Elevated Shift 10 54 67 Not Elevated Emotional Control 21 75 99 Highly Elevated Emotion Regulation Index (ERI) 31 71 95 Moderately Elevated Initiate 18 67 91 Mildly Elevated Working Memory 23 83 >99 Highly Elevated Plan/Organize 14 55 73 Not Elevated Task Monitor 11 59 82 Not Elevated Organization of Materials 15 60 83 Approaching an Elevation Cognitive Regulation Index (CRI) 81 69 93 Mildly Elevated Global Executive Composite (GEC) 140 70 95 Moderately Elevated  Validity Scale Raw Score Cumulative Percentile Protocol Classification Inconsistency 2 ?98 Acceptable Negativity 2 ?98 Acceptable Infrequency 0 ?98 Acceptable  Behavior Rating Inventory of Executive Function, Second Edition Event organiser) Informant:  Ms. Kosakowski parent, Publishing rights manager, completed the Informant Form of the Behavior Rating Inventory of Executive Function-Adult Version, Second Edition [employer]), which is equivalent to the Self-Report version and has three domains that evaluate cognitive, behavioral, and emotional regulation, and a Global  Executive Composite score provides an overall snapshot of executive functioning. There are no missing item responses in the protocol.  The Negativity, Infrequency, and Inconsistency scales are not elevated, suggesting they did not respond to the protocol in an overly negative, haphazard, extreme, or inconsistent manner. In the context of these validity considerations, Dantrece's ratings of Ms. Corona's everyday executive function suggest some areas of concern. The overall index score, the GEC, was within normal limits (GEC T = 61, %ile = 82).  The Behavior Regulation Index (BRI), Emotion Regulation Index (ERI), and Cognitive Regulation Index (CRI) scores were within normal limits (BRI T = 62, %ile = 86; ERI T = 64, %ile = 88; CRI T = 56, %ile = 77). Dantrece indicated Ms. 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 Dantrece did not describe Ms. Petronio's ability to resist impulses, be aware of their functioning in social settings, adjust well to changes, react to events appropriately, get going on tasks and activities and independently generate ideas, plan and organize their approach to problem-solving appropriately, be appropriately cautious in their approach to tasks and check for mistakes, and keep materials and belongings reasonably well-organized as problematic. However, the Shift, Emotional Control, Self-Monitor, and Initiate scales approached an abnormal elevation.   Scale/Index  Raw Score T Score Percentile Qualitative Description Inhibit 13 57 81 Not Elevated Self-Monitor 12 62 93 Approaching an Elevation Behavior Regulation Index (BRI) 25 62 86 Approaching an Elevation Shift 13 64 95 Approaching an Elevation Emotional Control 15 59 83 Not Elevated Emotion Regulation Index (ERI) 28 64 88 Approaching an Elevation Initiate 15 62 91 Approaching an Elevation Working Memory 15 65 93 Mildly Elevated Plan/Organize 11 50 63 Not Elevated Task-Monitor 8 49 55 Not Elevated Organization of  Materials 11 48 57 Not Elevated Cognitive Regulation Index (CRI) 60 56 77 Not Elevated Global Executive Composite (GEC) 113 61 82 Approaching an Elevation  Validity Scale Raw Score Cumulative Percentile Protocol Classification Negativity 4 99 Acceptable Infrequency 1 ?98 Acceptable Inconsistency 0 ?98 Acceptable  BEHAVIORAL FUNCTIONING   Patient Health Questionnaire-9 (PHQ-9): Ms. Abbasi completed the PHQ-9, a self-report measure that assesses symptoms of depression. She scored 19/27, which indicates moderately severe depression.   Generalized Anxiety Disorder-7 (GAD-7): Ms. Homstad completed the GAD-7, a self-report measure that assesses symptoms of anxiety. She scored 17/21, which indicates severe anxiety.   Adult ADHD Self-Report Scale Symptom Checklist (ASRS): Ms. Broden reported the following symptoms as sometimes: having difficulty getting things in order when a task requires organization and avoiding or delaying getting started on tasks requiring a lot of thought. She endorsed the following symptoms as occurring often: problems remembering appointments or obligations, leaving her seat when expected to stay seated, difficulty waiting for her turn in turn-taking situations, and interrupting others when they are busy. She endorsed the following symptoms as very often: difficulty wrapping up the final details of a project following the completion of challenging aspects, fidgeting or squirming, feeling overly active and compelled to do things, struggling to sustain attention when doing boring or repetitive work, struggling to concentrate on what people say even when they are speaking directly to her, misplacing or has difficulty finding things, being distracted by the noise around her, feeling restless or fidgety, difficulty relaxing, talking too much in social situations, and interrupting others or finishing their sentences. The endorsement of at least four items in Part A is highly consistent with  ADHD in adults. The frequency scores of Part B provide additional cues. Ms. Clere scored a 5/6 on Part A and 11/12 on Part B, which is considered a positive screening for ADHD.    Adult OCD Inventory (OCD-A) SF-20: The OCD-A SF-20 was administered. Ms. Kerschen scored 160/300, which is indicative of moderate problems with OCD-related concerns. She endorsed the following as "Yes, this stops me a little or wastes a little of my time": spending more time than needed to check her work, worrying about being clean, arranging things in certain ways or symmetrically, hating dirt or  dirty things, being unwilling to touch something that someone else has touched, and feeling compelled to do things that she does not really want to do. She endorsed the following as "Yes, this stops me from doing other things or wastes some of my time": having a special number, having to do things over a certain number of times before it's just right, worrying about germs that are on things, and moving or talking in special ways to avoid bad luck. She endorsed the following as "Yes, This stops me from doing a lot of things and wastes a lot of my time": having trouble making up her mind, having thoughts or words repeat themselves over and over in her mind, liking to eat the same foods, having to check things several times, feeling guilty over minor infractions, and finding herself counting things or having numbers frequently go through her mind.  Personality Assessment Inventory (PAI): The PAI is an objective inventory of adult personality. The validity indicators suggested Ms. Fair responded consistently to similar item content (ICN T = 52) and did not appear to portray herself in an unrealistically favorable manner (PIM T = 43); however, she endorsed some unusual responses (INF T = 63) and demonstrated an element of exaggeration of complaints and problems (NIM T = 77). Her results should be interpreted cautiously as they may overrepresent  the extent and degree of the issues endorsed. As such, this evaluator only broadly interpreted significant findings. She endorsed significant concerns about somatic function and problem impairment arising from them [facility] T = 73); considerable anxiety and tension (ANX T = 84 and STR T = 75), the possibility of specific fears (ARD-P T = 65), and prominent unhappiness and dysphoria (DEP T = 90) that may be at least partially explained by her having experienced a traumatic event(s) that continue to be a source of distress (ARD-T T = 84); impulsivity and emotional lability (BOR T = 78); periodic thoughts of self-harm (SUI T = 64); an activity level that is noticeably high even to casual observers (MAN-A T = 82); experiencing of a loosening of associations as well as self-expression and communication difficulties (SCZ-T T = 90), preferring to approach relationships in a passive manner (DOM T = 28), and tending to keep others at a distance (PAR-H T = 69) which may serve to decrease the discomfort that interpersonal contact fosters (SCZ-S T = 74); a proneness to being easily insulted or slighted and responding by holding grudges towards the offending party (PAR-R T = 66); and entertaining some ideas that others find unconventional or unusual (SCZ-P T = 60). She appears to acknowledge significant difficulties in functioning and perceives that help is needed in dealing with them (RXR T = 25).     SUMMARY AND CLINICAL IMPRESSIONS: Ms. Mindee Creppel is a 27 year old female who Alyssa Allwardt PA-C referred for an evaluation to determine if she currently meets the criteria for a diagnosis of Attention-Deficit/Hyperactivity Disorder (ADHD).   Ms. Lands reported an extended history of ADHD-related symptoms, noting that a childhood mental provider and a current medical provider expressed concerns she may have ADHD. She expressed a belief her ADHD-related symptoms are consistent and independent of mood.   During the  evaluation, Ms. Bookwalter was administered assessments to measure her current cognitive abilities. Her verbal comprehension abilities were in the borderline range, and her performance on the subtests was comparable. This suggests the various verbal cognitive abilities measured by these subtests are similarly developed. Her ability to sustain attention, concentrate,  and exert mental control was in the average range, which suggests her ability to sustain attention, concentrate, and exert mental control is better developed than her verbal reasoning abilities. Results of the CNS Vital Signs indicated an average neurocognitive processing ability, although she demonstrated a weakness in verbal memory and strengths in visual memory, working memory, and sustained attention.  During the clinical interview and on self-report measures, Ms. Zelmer endorsed executive functioning concerns that include attentional dysregulation, hyperactivity- and impulsivity-related symptoms, and meeting the full criteria for ADHD. However, her parent, Ninfa Search, indicated she perceives Ms. Renaud as experiencing subtle-to-mild executive functioning issues. It is currently unclear what is causing this discrepancy in their perception of her executive functioning. However, Ms. Weisman described how she is not regularly around her mother so her mother may not be aware of the difficulties she is experiencing. She also expressed a belief that she has a more accurate perception of her executive functioning versus her mother. While invalid and discrepant test results make interpretation difficult, when considering her self-reported symptoms; endorsed executive functioning issues; reported possible familial history of ADHD; and a past mental health provider reportedly expressed concerns she has a diagnosis of ADHD, a diagnosis of F90.2 Attention-Deficit/Hyperactivity Disorder, Combined Presentation, Moderate appears warranted. The specifier  of "Moderate" as she endorsed symptoms over what is needed to make the diagnosis and indicated they cause impairment in her academic (e.g., trouble sustaining her attention in classes, forgetting to complete assignments, and difficulty staying seated), occupational (e.g., receiving comments from her employer about her difficulty staying seated for required periods of time), social (e.g., frequently engaging in excessive talking and interrupting of others), and daily (e.g., regularly experiencing being easily distracted, task initiation and completion issues, disorganization, and forgetfulness) functioning.   Ms. Stride also endorsed a history of depressive episodes; generalized anxiety; obsessions and compulsions; sleep onset and maintenance issues with the use of a sleep aid; and alternating between inadequate daily caloric intake and overeating-related behaviors. As such, the PHQ-9, GAD-7, OCD-A, and PAI were administered. Her results suggested she experiences moderately severe depression, severe anxiety, and moderate OCD-related concerns. When considering clinical interview and chart review information as well as her results on the measures mentioned above, diagnoses of F33.1 Major Depressive Disorder, recurrent, moderate and F41.1 Generalized Anxiety Disorder appear warranted. However, given the limited scope of this evaluation, it was unable to be determined if the full criteria for OCD, sleep-wake disorder, and eating disorder are met or if the symptoms are better explained by her diagnoses of ADHD, major depressive disorder, and/or generalized anxiety disorder. She would likely benefit from further evaluation of these symptoms to definitively rule in or out generalized anxiety disorder and major depressive disorder.  Should any of the aforementioned be ruled in, they would likely be in addition to her diagnosis of ADHD, as she described her ADHD-related concerns as consistent, independent of mood and  intrusive thoughts, and occurring before some of the concerns.    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 Ms. Gebert would likely benefit from making use of strategies for ADHD symptoms:  a. Setting a timer to complete tasks. b. Break tasks into manageable chunks and spread them out over more extended periods with breaks.  c. Utilizing lists and day calendars to keep track of tasks.  d. Answering emails daily.  e. Improve listening skills by asking the speaker to give information in smaller chunks  and asking for explanations and clarification as needed. f. Leaving more than the anticipated time to complete tasks. 2. It may help to keep tasks brief, well within your attention span, and a mix of both high and low-interest tasks. Tasks may be gradually increased in length. 3. Practice proactive planning by setting aside time every evening to plan for the next day (e.g., prepare needed materials or pack the car the night before).  4. Learn how to make a practical and reasonable "to-do" list of important tasks and priorities. Always keep it easily accessible and make additional copies in case it is lost or misplaced. 5. Utilize visual reminders by posting appointments, "to-do lists," or schedules in strategic areas at home and work.  6. Practice using an appointment book, smartphone, or other tech device, or a daily planning calendar, and learn to write down appointments and commitments immediately. 7. Keep notepads or use a portable audio recorder to capture important ideas that would be beneficial to recall later. 8. Learn and practice time management skills. Purchase a programmable alarm watch or set an alarm on a smartphone to avoid losing track of time.  9. Use a color-coded file system, desk and closet organizers,  storage boxes, or other organization devices to reduce clutter and improve efficiency and structure.  10. Implement ways to become more aware of your actions and to inhibit or adjust them as warranted (e.g., reviewing videos of your actions, considering consequences of obeying or not obeying the rules of various upcoming situations, having a trusted other to discuss plans with, and/or provide cues to stop certain behaviors, and make visual cues for rules you would like to follow). 11. The 4Rs: Read just one paragraph, recite out loud in a soft voice or whisper what was important in that material, write that material down in a notebook, then review what you just wrote. 12. Stay flexible and be prepared to change your plans, as symptom breakthroughs and crises will likely occur periodically. 13. Ms. Overweg may benefit from mindfulness training to address symptoms of inattention.  14. Ms. Banasik would likely benefit from a consultation regarding medication for ADHD symptoms.   15. Individual therapeutic services may assist in processing a diagnosis of ADHD and discussing coping and compensatory strategies. 16. Mental alertness/energy can be raised by increasing exercise; improving sleep; eating a healthy diet; and managing depression and anxiety. Consulting with a physician regarding any changes to the physical regimen is recommended. 17. "Failing at Normal: An ADHD Success Story" by Calvert Cantor is a great overview of ADHD. Dr. Janese Banks also has a YouTube channel with helpful videos on ADHD-related topics: http://www.mitchell-reyes.biz/ 18. Applications:   RescueTime. Tracks your activities on your phone and/or computer to determine how productive you have been and what distracted you. Free two-week trial.   Focus@Will . It uses engineered audio that may reduce distractions and assist with focus. Free 15-day trial.  Freedom. Allows you to highlight days and times you want  to block yourself from certain sites or apps. Free trial.  Vesta Mixer.  It allows you to input your bank accounts and creates a visual layout of information about your financial goals, budget management, alerts, etc. May offer a free trial.  Boomerang. It allows you to schedule times an email is sent and to see if others have received or opened your email. Ten messages free per month and a free trial of the premium version.  IFTTT. Uses "channels" to create various actions (e.g., if you are mentioned  in an email, highlight it in your inbox, and if you miss a call, add it to a to-do list). Free and premium versions.  Unroll.me. Cleans up your email by unsubscribing from what you do not want to receive while still getting everything you do. Free.  Finish. It allows you to divide two-list tasks into short-term, mid-term, and long-term tasks and determine how much time is left for a task. Focus mode hides non-priority tasks.   Autosilent. Turns your phone ringer on and off based on specified calendars, geo-fences, timers, etc. $3.99.  Freakyalarm. It makes you solve math problems to disable an alarm. $1.99.  Wake N Shake. It makes you vigorously shake your phone to stop the alarm. $.99.  Todoist. It allows you to add sub-tasks to tasks and includes email and web plugins to make it work across the system. Premium has location-based reminders, calendar sync, productive tracking, etc.   Sleep Cycle. Utilize your phone's motion sensors to catch movement while you are asleep. The alarm will wake you as early as 30 minutes before your alarm based on your lightest sleep phase and show you how daily activities affect your sleep quality.  19. Books:  "Taking Charge of Adult ADHD Second Edition" by Dr. Janese Banks  "The ADHD Effect on Marriage" by Annamarie Dawley  "The Couples Guide to Thriving with ADHD" by Annamarie Dawley 20. Organizations that are a reliable source of information on ADHD:   Children and Adults  with Attention-Deficit/Hyperactivity Disorder (CHADD): chadd.org   Attention Deficit Disorder Association (ADDA): HotterNames.de  ADD Resources: addresources.org  ADD WareHouse: addwarehouse.com  World Federation of ADHD: adhd-federation.org  ADDConsults: addconsults.com  Compilation of ADHD resources: https://www.harrell.com/ 21. Future evaluation, if deemed necessary, and/or to determine the effectiveness of recommended interventions.   Helmut Muster, Psy.D. Licensed Psychologist - [facility]-P [phone]  References  Laureen Ochs, R. A. (2021). Taking charge of adult ADHD: proven strategies to succeed at work, at   home, and in relationships (pp. 6-10 and 272-276). [employer].              Margarite Gouge, PsyD

## 2023-05-13 ENCOUNTER — Ambulatory Visit: Payer: BC Managed Care – PPO | Admitting: Physician Assistant

## 2023-05-23 ENCOUNTER — Encounter: Payer: Self-pay | Admitting: Gastroenterology

## 2023-05-27 ENCOUNTER — Encounter: Payer: Self-pay | Admitting: Gastroenterology

## 2023-05-27 ENCOUNTER — Ambulatory Visit (AMBULATORY_SURGERY_CENTER): Payer: BC Managed Care – PPO | Admitting: Gastroenterology

## 2023-05-27 VITALS — BP 110/66 | HR 69 | Temp 98.4°F | Resp 18 | Ht 66.0 in | Wt 240.0 lb

## 2023-05-27 DIAGNOSIS — R112 Nausea with vomiting, unspecified: Secondary | ICD-10-CM

## 2023-05-27 DIAGNOSIS — K2980 Duodenitis without bleeding: Secondary | ICD-10-CM | POA: Diagnosis not present

## 2023-05-27 DIAGNOSIS — K529 Noninfective gastroenteritis and colitis, unspecified: Secondary | ICD-10-CM

## 2023-05-27 DIAGNOSIS — K449 Diaphragmatic hernia without obstruction or gangrene: Secondary | ICD-10-CM | POA: Diagnosis not present

## 2023-05-27 DIAGNOSIS — K640 First degree hemorrhoids: Secondary | ICD-10-CM | POA: Diagnosis not present

## 2023-05-27 DIAGNOSIS — K3189 Other diseases of stomach and duodenum: Secondary | ICD-10-CM | POA: Diagnosis not present

## 2023-05-27 DIAGNOSIS — K5730 Diverticulosis of large intestine without perforation or abscess without bleeding: Secondary | ICD-10-CM

## 2023-05-27 DIAGNOSIS — K2970 Gastritis, unspecified, without bleeding: Secondary | ICD-10-CM

## 2023-05-27 DIAGNOSIS — R197 Diarrhea, unspecified: Secondary | ICD-10-CM | POA: Diagnosis not present

## 2023-05-27 DIAGNOSIS — K229 Disease of esophagus, unspecified: Secondary | ICD-10-CM

## 2023-05-27 DIAGNOSIS — K2289 Other specified disease of esophagus: Secondary | ICD-10-CM | POA: Diagnosis not present

## 2023-05-27 DIAGNOSIS — K6389 Other specified diseases of intestine: Secondary | ICD-10-CM | POA: Diagnosis not present

## 2023-05-27 MED ORDER — SODIUM CHLORIDE 0.9 % IV SOLN
500.0000 mL | Freq: Once | INTRAVENOUS | Status: DC
Start: 2023-05-27 — End: 2023-05-27

## 2023-05-27 MED ORDER — ESOMEPRAZOLE MAGNESIUM 40 MG PO CPDR: 40.0000 mg | DELAYED_RELEASE_CAPSULE | Freq: Every day | ORAL | 6 refills | Status: AC

## 2023-05-27 NOTE — Op Note (Signed)
 [facility] Patient Name: Sandy Ryan Procedure Date: 05/27/2023 1:59 PM MRN: [id number] Endoscopist: Aloha Finner , MD, [id number] Age: 28 Referring MD:  Date of Birth: 08/04/1995 Gender: Female Account #: [id number] Procedure:                Upper GI endoscopy Indications:              Diarrhea, Nausea Medicines:                Monitored Anesthesia Care Procedure:                Pre-Anesthesia Assessment:                           - Prior to the procedure, a History and Physical                            was performed, and patient medications and                            allergies were reviewed. The patient's tolerance of                            previous anesthesia was also reviewed. The risks                            and benefits of the procedure and the sedation                            options and risks were discussed with the patient.                            All questions were answered, and informed consent                            was obtained. Prior Anticoagulants: The patient has                            taken no anticoagulant or antiplatelet agents                            except for NSAID medication. ASA Grade Assessment:                            II - A patient with mild systemic disease. After                            reviewing the risks and benefits, the patient was                            deemed in satisfactory condition to undergo the                            procedure.  After obtaining informed consent, the endoscope was                            passed under direct vision. Throughout the                            procedure, the patient's blood pressure, pulse, and                            oxygen saturations were monitored continuously. The                            GIF [id number] #[id number] was introduced through the                            mouth, and advanced to the second part of duodenum.                             The upper GI endoscopy was accomplished without                            difficulty. The patient tolerated the procedure. Scope In: Scope Out: Findings:                 No gross lesions were noted in the entire esophagus.                           The Z-line was irregular and was found 35 cm from                            the incisors.                           A 2 cm hiatal hernia was present.                           A few localized small erosions with no bleeding and                            no stigmata of recent bleeding were found in the                            gastric antrum.                           No other gross lesions were noted in the entire                            examined stomach. Biopsies were taken with a cold                            forceps for histology and Helicobacter pylori  testing.                           No gross lesions were noted in the duodenal bulb,                            in the first portion of the duodenum and in the                            second portion of the duodenum. Biopsies were taken                            with a cold forceps for histology. Complications:            No immediate complications. Estimated Blood Loss:     Estimated blood loss was minimal. Impression:               - No gross lesions in the entire esophagus. Z-line                            irregular, 35 cm from the incisors.                           - 2 cm hiatal hernia.                           - Erosive gastropathy with no bleeding and no                            stigmata of recent bleeding within the antrum. No                            other gross lesions in the entire stomach. Biopsied.                           - No gross lesions in the duodenal bulb, in the                            first portion of the duodenum and in the second                            portion of the duodenum.  Biopsied. Recommendation:           - Proceed to scheduled colonoscopy.                           - Initiate Nexium  40 mg once daily.                           - Continue present medications.                           - Await pathology results.                           -  The findings and recommendations were discussed                            with the patient.                           - The findings and recommendations were discussed                            with the designated responsible adult. Aloha Finner, MD 05/27/2023 2:41:54 PM

## 2023-05-27 NOTE — Op Note (Signed)
 [facility] Patient Name: Sandy Ryan Procedure Date: 05/27/2023 1:52 PM MRN: [id number] Endoscopist: Aloha Finner , MD, [id number] Age: 28 Referring MD:  Date of Birth: 12-24-1995 Gender: Female Account #: [id number] Procedure:                Colonoscopy Indications:              Chronic diarrhea Medicines:                Monitored Anesthesia Care Procedure:                Pre-Anesthesia Assessment:                           - Prior to the procedure, a History and Physical                            was performed, and patient medications and                            allergies were reviewed. The patient's tolerance of                            previous anesthesia was also reviewed. The risks                            and benefits of the procedure and the sedation                            options and risks were discussed with the patient.                            All questions were answered, and informed consent                            was obtained. Prior Anticoagulants: The patient has                            taken no anticoagulant or antiplatelet agents                            except for NSAID medication. ASA Grade Assessment:                            II - A patient with mild systemic disease. After                            reviewing the risks and benefits, the patient was                            deemed in satisfactory condition to undergo the                            procedure.  After obtaining informed consent, the colonoscope                            was passed under direct vision. Throughout the                            procedure, the patient's blood pressure, pulse, and                            oxygen saturations were monitored continuously. The                            Olympus Scope SN: [id number] was introduced through                            the anus and advanced to the 6 cm into the ileum.                             The colonoscopy was performed without difficulty.                            The patient tolerated the procedure. The quality of                            the bowel preparation was good. The terminal ileum,                            ileocecal valve, appendiceal orifice, and rectum                            were photographed. Scope In: 2:21:32 PM Scope Out: 2:35:48 PM Scope Withdrawal Time: 0 hours 12 minutes 45 seconds  Total Procedure Duration: 0 hours 14 minutes 16 seconds  Findings:                 The digital rectal exam was normal. Pertinent                            negatives include no palpable rectal lesions.                           The terminal ileum and ileocecal valve appeared                            normal. Biopsies were taken with a cold forceps for                            histology.                           Normal mucosa was found in the transverse colon, at                            the hepatic flexure, in the ascending colon and in  the cecum. Biopsies were taken with a cold forceps                            for histology.                           A segmental area of granular mucosa was found in                            the recto-sigmoid colon, in the sigmoid colon and                            in the descending colon. Biopsies were taken with a                            cold forceps for histology.                           A few small-mouthed diverticula were found in the                            recto-sigmoid colon and sigmoid colon.                           Normal mucosa was found in the rectum. Biopsies                            were taken with a cold forceps for histology.                           Non-bleeding non-thrombosed internal hemorrhoids                            were found during retroflexion, during perianal                            exam and during digital exam. The hemorrhoids were                             Grade I (internal hemorrhoids that do not prolapse). Complications:            No immediate complications. Estimated Blood Loss:     Estimated blood loss was minimal. Impression:               - The examined portion of the ileum was normal.                            Biopsied.                           - Normal mucosa in the transverse colon, at the                            hepatic flexure, in the ascending colon and in the  cecum. Biopsied.                           - Granularity in the recto-sigmoid colon, in the                            sigmoid colon and in the descending colon. Biopsied.                           - A few scattered diverticula noted in the                            recto-sigmoid colon and in the sigmoid colon.                           - Normal mucosa in the rectum. Biopsied.                           - Non-bleeding non-thrombosed internal hemorrhoids. Recommendation:           - The patient will be observed post-procedure,                            until all discharge criteria are met.                           - Discharge patient to home.                           - Patient has a contact number available for                            emergencies. The signs and symptoms of potential                            delayed complications were discussed with the                            patient. Return to normal activities tomorrow.                            Written discharge instructions were provided to the                            patient.                           - High fiber diet.                           - Use FiberCon 1-2 tablets PO daily.                           - Continue present medications.                           -  Await pathology results.                           - Repeat colonoscopy for surveillance based on                            pathology results. Want to rule out chronic                             inflammation/colitis that may be reason for                            patient's longer-term symptoms.                           - If issues of diarrhea remain, consider EPI                            evaluation, SIBO evaluation, IBS?"D diarrhea                            treatment empirically.                           - The findings and recommendations were discussed                            with the patient.                           - The findings and recommendations were discussed                            with the designated responsible adult. Aloha Finner, MD 05/27/2023 2:46:02 PM

## 2023-05-27 NOTE — Patient Instructions (Addendum)
 Handouts Provided:  Polyps, Gastritis, Hiatal Hernia and High Fiber Diet.  Use FiberCon 1-2 tablets daily.   START taking Nexium  40 mg once daily.  Medication has been sent to your pharmacy.  YOU HAD AN ENDOSCOPIC PROCEDURE TODAY AT THE [facility]:   Refer to the procedure report that was given to you for any specific questions about what was found during the examination.  If the procedure report does not answer your questions, please call your gastroenterologist to clarify.  If you requested that your care partner not be given the details of your procedure findings, then the procedure report has been included in a sealed envelope for you to review at your convenience later.  YOU SHOULD EXPECT: Some feelings of bloating in the abdomen. Passage of more gas than usual.  Walking can help get rid of the air that was put into your GI tract during the procedure and reduce the bloating. If you had a lower endoscopy (such as a colonoscopy or flexible sigmoidoscopy) you may notice spotting of blood in your stool or on the toilet paper. If you underwent a bowel prep for your procedure, you may not have a normal bowel movement for a few days.  Please Note:  You might notice some irritation and congestion in your nose or some drainage.  This is from the oxygen used during your procedure.  There is no need for concern and it should clear up in a day or so.  SYMPTOMS TO REPORT IMMEDIATELY:  Following lower endoscopy (colonoscopy or flexible sigmoidoscopy):  Excessive amounts of blood in the stool  Significant tenderness or worsening of abdominal pains  Swelling of the abdomen that is new, acute  Fever of 100F or higher  Following upper endoscopy (EGD)  Vomiting of blood or coffee ground material  New chest pain or pain under the shoulder blades  Painful or persistently difficult swallowing  New shortness of breath  Fever of 100F or higher  Black, tarry-looking stools  For urgent or  emergent issues, a gastroenterologist can be reached at any hour by calling [phone]. Do not use MyChart messaging for urgent concerns.    DIET:  We do recommend a small meal at first, but then you may proceed to your regular diet.  Drink plenty of fluids but you should avoid alcoholic beverages for 24 hours.  ACTIVITY:  You should plan to take it easy for the rest of today and you should NOT DRIVE or use heavy machinery until tomorrow (because of the sedation medicines used during the test).    FOLLOW UP: Our staff will call the number listed on your records the next business day following your procedure.  We will call around 7:15- 8:00 am to check on you and address any questions or concerns that you may have regarding the information given to you following your procedure. If we do not reach you, we will leave a message.     If any biopsies were taken you will be contacted by phone or by letter within the next 1-3 weeks.  Please call us  at [phone] if you have not heard about the biopsies in 3 weeks.    SIGNATURES/CONFIDENTIALITY: You and/or your care partner have signed paperwork which will be entered into your electronic medical record.  These signatures attest to the fact that that the information above on your After Visit Summary has been reviewed and is understood.  Full responsibility of the confidentiality of this discharge information lies with  you and/or your care-partner.

## 2023-05-27 NOTE — Progress Notes (Signed)
 GASTROENTEROLOGY PROCEDURE H&P NOTE   Primary Care Physician: Allwardt, Mardy HERO, PA-C  HPI: Sandy Ryan is a 28 y.o. female who presents for EGD/Colonoscopy for chronic N/V/Diarrhea.  Past Medical History:  Diagnosis Date   ADHD 05/12/2023   Anxiety    Asthma    Past Surgical History:  Procedure Laterality Date   ELBOW SURGERY     KNEE SURGERY Right 01/30/2015   torn meniscus and ACL   SHOULDER SURGERY     thumb surgery Left 2010   Current Outpatient Medications  Medication Sig Dispense Refill   baclofen  (LIORESAL ) 10 MG tablet Take 1 tablet (10 mg total) by mouth 2 (two) times daily. Take for muscle relaxation. 30 each 0   buPROPion  (WELLBUTRIN  XL) 150 MG 24 hr tablet Take 1 tablet (150 mg total) by mouth daily. Take in the morning. 30 tablet 2   dicyclomine  (BENTYL ) 10 MG capsule Take 1 capsule (10 mg total) by mouth 4 (four) times daily -  before meals and at bedtime. 120 capsule 0   ibuprofen  (ADVIL ) 200 MG tablet Take 200 mg by mouth every 6 (six) hours as needed.     meloxicam  (MOBIC ) 15 MG tablet Take 1 tablet (15 mg total) by mouth daily. 30 tablet 1   Vitamin D , Ergocalciferol , (DRISDOL ) 1.25 MG (50000 UNIT) CAPS capsule Take 1 capsule (50,000 Units total) by mouth every 7 (seven) days. 12 capsule 0   traZODone  (DESYREL ) 50 MG tablet Take 0.5-1 tablets (25-50 mg total) by mouth at bedtime as needed for sleep. 30 tablet 3   Current Facility-Administered Medications  Medication Dose Route Frequency Provider Last Rate Last Admin   0.9 %  sodium chloride  infusion  500 mL Intravenous Once Mansouraty, Lovett Coffin Jr., MD        Current Outpatient Medications:    baclofen  (LIORESAL ) 10 MG tablet, Take 1 tablet (10 mg total) by mouth 2 (two) times daily. Take for muscle relaxation., Disp: 30 each, Rfl: 0   buPROPion  (WELLBUTRIN  XL) 150 MG 24 hr tablet, Take 1 tablet (150 mg total) by mouth daily. Take in the morning., Disp: 30 tablet, Rfl: 2   dicyclomine  (BENTYL ) 10 MG  capsule, Take 1 capsule (10 mg total) by mouth 4 (four) times daily -  before meals and at bedtime., Disp: 120 capsule, Rfl: 0   ibuprofen  (ADVIL ) 200 MG tablet, Take 200 mg by mouth every 6 (six) hours as needed., Disp: , Rfl:    meloxicam  (MOBIC ) 15 MG tablet, Take 1 tablet (15 mg total) by mouth daily., Disp: 30 tablet, Rfl: 1   Vitamin D , Ergocalciferol , (DRISDOL ) 1.25 MG (50000 UNIT) CAPS capsule, Take 1 capsule (50,000 Units total) by mouth every 7 (seven) days., Disp: 12 capsule, Rfl: 0   traZODone  (DESYREL ) 50 MG tablet, Take 0.5-1 tablets (25-50 mg total) by mouth at bedtime as needed for sleep., Disp: 30 tablet, Rfl: 3  Current Facility-Administered Medications:    0.9 %  sodium chloride  infusion, 500 mL, Intravenous, Once, Mansouraty, Aloha Raddle., MD Allergies  Allergen Reactions   Other Rash   Tape Other (See Comments)    Adhesive-Blisters on the skin   Tapentadol Hives    adhesive adhesive    Wound Dressing Adhesive     Adhesive-Causes blisters   Family History  Problem Relation Age of Onset   Hypertension Mother    Colon cancer Neg Hx    Esophageal cancer Neg Hx    Stomach cancer Neg Hx    Social  History   Socioeconomic History   Marital status: Single    Spouse name: Not on file   Number of children: 0   Years of education: 14   Highest education level: Associate degree: academic program  Occupational History   Occupation: conservation officer, historic buildings  Tobacco Use   Smoking status: Never   Smokeless tobacco: Never  Vaping Use   Vaping status: Never Used  Substance and Sexual Activity   Alcohol use: Yes    Comment: socially   Drug use: No   Sexual activity: Yes    Comment: Bisexual  Other Topics Concern   Not on file  Social History Narrative   Not on file   Social Drivers of Health   Financial Resource Strain: Low Risk  (05/10/2023)   Overall Financial Resource Strain (CARDIA)    Difficulty of Paying Living Expenses: Not hard at all  Food Insecurity: No Food  Insecurity (05/10/2023)   Hunger Vital Sign    Worried About Running Out of Food in the Last Year: Never true    Ran Out of Food in the Last Year: Never true  Transportation Needs: No Transportation Needs (05/10/2023)   PRAPARE - Administrator, Civil Service (Medical): No    Lack of Transportation (Non-Medical): No  Physical Activity: Unknown (05/10/2023)   Exercise Vital Sign    Days of Exercise per Week: Patient declined    Minutes of Exercise per Session: 90 min  Stress: Stress Concern Present (05/10/2023)   [employer] of Occupational Health - Occupational Stress Questionnaire    Feeling of Stress : Very much  Social Connections: Unknown (05/10/2023)   Social Connection and Isolation Panel [NHANES]    Frequency of Communication with Friends and Family: More than three times a week    Frequency of Social Gatherings with Friends and Family: Once a week    Attends Religious Services: Patient declined    Database Administrator or Organizations: No    Attends Engineer, Structural: More than 4 times per year    Marital Status: Living with partner  Intimate Partner Violence: Not on file    Physical Exam: Today's Vitals   05/27/23 1330 05/27/23 1357  BP: 133/63 130/81  Pulse: 76 64  Resp:  (!) 22  Temp: 98.4 F (36.9 C)   TempSrc: Skin   SpO2: 99% 97%  Weight: 240 lb (108.9 kg)   Height: 5' 6 (1.676 m)    Body mass index is 38.74 kg/m. GEN: NAD EYE: Sclerae anicteric ENT: MMM CV: Non-tachycardic GI: Soft, NT/ND NEURO:  Alert & Oriented x 3  Lab Results: No results for input(s): WBC, HGB, HCT, PLT in the last 72 hours. BMET No results for input(s): NA, K, CL, CO2, GLUCOSE, BUN, CREATININE, CALCIUM in the last 72 hours. LFT No results for input(s): PROT, ALBUMIN, AST, ALT, ALKPHOS, BILITOT, BILIDIR, IBILI in the last 72 hours. PT/INR No results for input(s): LABPROT, INR in the last 72  hours.   Impression / Plan: This is a 28 y.o.female  who presents for EGD/Colonoscopy for chronic N/V/Diarrhea.  The risks and benefits of endoscopic evaluation/treatment were discussed with the patient and/or family; these include but are not limited to the risk of perforation, infection, bleeding, missed lesions, lack of diagnosis, severe illness requiring hospitalization, as well as anesthesia and sedation related illnesses.  The patient's history has been reviewed, patient examined, no change in status, and deemed stable for procedure.  The patient and/or family  is agreeable to proceed.    Aloha Finner, MD [facility] Office # [phone]

## 2023-05-27 NOTE — Progress Notes (Signed)
 Called to room to assist during endoscopic procedure.  Patient ID and intended procedure confirmed with present staff. Received instructions for my participation in the procedure from the performing physician.

## 2023-05-27 NOTE — Progress Notes (Signed)
 Pt's states no medical or surgical changes since previsit or office visit.

## 2023-05-27 NOTE — Progress Notes (Signed)
 Report to PACU, RN, vss, BBS= Clear.

## 2023-05-30 ENCOUNTER — Telehealth: Payer: Self-pay | Admitting: *Deleted

## 2023-05-30 NOTE — Telephone Encounter (Signed)
  Follow up Call-     05/27/2023    1:30 PM  Call back number  Post procedure Call Back phone  # [phone]  Permission to leave phone message Yes   [facility]

## 2023-06-01 ENCOUNTER — Encounter: Payer: BC Managed Care – PPO | Admitting: Physician Assistant

## 2023-06-01 LAB — SURGICAL PATHOLOGY

## 2023-06-02 ENCOUNTER — Encounter: Payer: Self-pay | Admitting: Gastroenterology

## 2023-06-14 ENCOUNTER — Ambulatory Visit (INDEPENDENT_AMBULATORY_CARE_PROVIDER_SITE_OTHER): Payer: BC Managed Care – PPO | Admitting: Physician Assistant

## 2023-06-14 ENCOUNTER — Encounter: Payer: Self-pay | Admitting: Physician Assistant

## 2023-06-14 VITALS — BP 124/78 | HR 99 | Temp 97.7°F | Ht 66.54 in | Wt 244.4 lb

## 2023-06-14 DIAGNOSIS — E559 Vitamin D deficiency, unspecified: Secondary | ICD-10-CM

## 2023-06-14 DIAGNOSIS — E538 Deficiency of other specified B group vitamins: Secondary | ICD-10-CM | POA: Diagnosis not present

## 2023-06-14 DIAGNOSIS — Z0000 Encounter for general adult medical examination without abnormal findings: Secondary | ICD-10-CM

## 2023-06-14 LAB — COMPREHENSIVE METABOLIC PANEL
ALT: 13 U/L (ref 0–35)
AST: 13 U/L (ref 0–37)
Albumin: 3.8 g/dL (ref 3.5–5.2)
Alkaline Phosphatase: 64 U/L (ref 39–117)
BUN: 11 mg/dL (ref 6–23)
CO2: 23 meq/L (ref 19–32)
Calcium: 8.4 mg/dL (ref 8.4–10.5)
Chloride: 107 meq/L (ref 96–112)
Creatinine, Ser: 0.74 mg/dL (ref 0.40–1.20)
GFR: 110.16 mL/min (ref >60.00)
Glucose, Bld: 77 mg/dL (ref 70–99)
Potassium: 4 meq/L (ref 3.5–5.1)
Sodium: 139 meq/L (ref 135–145)
Total Bilirubin: 0.2 mg/dL (ref 0.2–1.2)
Total Protein: 6.7 g/dL (ref 6.0–8.3)

## 2023-06-14 LAB — LIPID PANEL
Cholesterol: 157 mg/dL (ref 0–200)
HDL: 55.6 mg/dL (ref >39.00)
LDL Cholesterol: 86 mg/dL (ref 0–99)
NonHDL: 101.36
Total CHOL/HDL Ratio: 3
Triglycerides: 75 mg/dL (ref 0.0–149.0)
VLDL: 15 mg/dL (ref 0.0–40.0)

## 2023-06-14 LAB — CBC WITH DIFFERENTIAL/PLATELET
Basophils Absolute: 0.1 10*3/uL (ref 0.0–0.1)
Basophils Relative: 1 % (ref 0.0–3.0)
Eosinophils Absolute: 0.3 10*3/uL (ref 0.0–0.7)
Eosinophils Relative: 4.5 % (ref 0.0–5.0)
HCT: 33.4 % — ABNORMAL LOW (ref 36.0–46.0)
Hemoglobin: 10.9 g/dL — ABNORMAL LOW (ref 12.0–15.0)
Lymphocytes Relative: 44 % (ref 12.0–46.0)
Lymphs Abs: 2.9 10*3/uL (ref 0.7–4.0)
MCHC: 32.7 g/dL (ref 30.0–36.0)
MCV: 78.8 fL (ref 78.0–100.0)
Monocytes Absolute: 0.5 10*3/uL (ref 0.1–1.0)
Monocytes Relative: 7.6 % (ref 3.0–12.0)
Neutro Abs: 2.9 10*3/uL (ref 1.4–7.7)
Neutrophils Relative %: 42.9 % — ABNORMAL LOW (ref 43.0–77.0)
Platelets: 373 10*3/uL (ref 150.0–400.0)
RBC: 4.24 Mil/uL (ref 3.87–5.11)
RDW: 17.9 % — ABNORMAL HIGH (ref 11.5–15.5)
WBC: 6.7 10*3/uL (ref 4.0–10.5)

## 2023-06-14 LAB — HEMOGLOBIN A1C: Hgb A1c MFr Bld: 5.9 % (ref 4.6–6.5)

## 2023-06-14 LAB — VITAMIN B12: Vitamin B-12: 259 pg/mL (ref 211–911)

## 2023-06-14 LAB — VITAMIN D 25 HYDROXY (VIT D DEFICIENCY, FRACTURES): VITD: 11.39 ng/mL — ABNORMAL LOW (ref 30.00–100.00)

## 2023-06-14 LAB — TSH: TSH: 2.44 u[IU]/mL (ref 0.35–5.50)

## 2023-06-14 NOTE — Progress Notes (Signed)
Patient ID: Keshawna Dix, female    DOB: [birth year], 29 y.o.   MRN: [id number]   Assessment & Plan:  Annual physical exam -     CBC with Differential/Platelet -     Comprehensive metabolic panel -     Hemoglobin A1c -     Lipid panel -     TSH -     Vitamin B12 -     VITAMIN D 25 Hydroxy (Vit-D Deficiency, Fractures)  B12 deficiency -     Vitamin B12  Vitamin D deficiency -     VITAMIN D 25 Hydroxy (Vit-D Deficiency, Fractures)    Age-appropriate screening and counseling performed today. Will check labs and call with results. Preventive measures discussed and printed in AVS for patient.   Patient Counseling: [x]   Nutrition: Stressed importance of moderation in sodium/caffeine intake, saturated fat and cholesterol, caloric balance, sufficient intake of fresh fruits, vegetables, and fiber.  [x]   Stressed the importance of regular exercise.   [x]   Substance Abuse: Discussed cessation/primary prevention of tobacco, alcohol, or other drug use; driving or other dangerous activities under the influence; availability of treatment for abuse.   []   Injury prevention: Discussed safety belts, safety helmets, smoke detector, smoking near bedding or upholstery.   [x]   Sexuality: Discussed sexually transmitted diseases, partner selection, use of condoms, avoidance of unintended pregnancy  and contraceptive alternatives.   [x]   Dental health: Discussed importance of regular tooth brushing, flossing, and dental visits.  [x]   Health maintenance and immunizations reviewed. Please refer to Health maintenance section.       Return in about 1 year (around 06/13/2024) for well woman.    Subjective:    Chief Complaint  Patient presents with   Annual Exam    Pt in office for annual CPE and fasting labs;     HPI Patient is in today for annual exam.  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  Nutrition: no spicy foods Mental health: doing well   Sleep: doing ok, up and down sometimes Substance use: none Sexual activity: monogamous, no need for testing per patient  Immunizations: UTD Pap: due Nov 2025    Past Medical History:  Diagnosis Date   ADHD 05/12/2023   Anxiety    Asthma     Past Surgical History:  Procedure Laterality Date   ELBOW SURGERY     KNEE SURGERY Right 01/30/2015   torn meniscus and ACL   SHOULDER SURGERY     thumb surgery Left 2010    Family History  Problem Relation Age of Onset   Hypertension Mother    Colon cancer Neg Hx    Esophageal cancer Neg Hx    Stomach cancer Neg Hx     Social History   Tobacco Use   Smoking status: Never   Smokeless tobacco: Never  Vaping Use   Vaping status: Never Used  Substance Use Topics   Alcohol use: Yes    Comment: socially   Drug use: No     Allergies  Allergen Reactions   Other Rash   Tape Other (See Comments)    Adhesive-Blisters on the skin   Tapentadol Hives    adhesive adhesive    Wound Dressing Adhesive     Adhesive-Causes blisters    Review of Systems NEGATIVE UNLESS OTHERWISE INDICATED IN HPI      Objective:     BP 124/78 (BP Location: Left Arm, Patient Position: Sitting, Cuff Size: Normal)  Pulse 99   Temp 97.7 F (36.5 C) (Temporal)   Ht 5' 6.54" (1.69 m)   Wt 244 lb 6.4 oz (110.9 kg)   LMP 05/28/2023 (Exact Date)   SpO2 98%   BMI 38.82 kg/m   Wt Readings from Last 3 Encounters:  06/14/23 244 lb 6.4 oz (110.9 kg)  05/27/23 240 lb (108.9 kg)  05/10/23 241 lb 9.6 oz (109.6 kg)    BP Readings from Last 3 Encounters:  06/14/23 124/78  05/27/23 110/66  05/10/23 118/76     Physical Exam Vitals and nursing note reviewed.  Constitutional:      Appearance: Normal appearance. She is obese. She is not toxic-appearing.  HENT:     Head: Normocephalic and atraumatic.     Right Ear: Tympanic membrane, ear canal and external ear normal.     Left Ear: Tympanic membrane, ear canal and external ear normal.      Nose: Nose normal.     Mouth/Throat:     Mouth: Mucous membranes are moist.  Eyes:     Extraocular Movements: Extraocular movements intact.     Conjunctiva/sclera: Conjunctivae normal.     Pupils: Pupils are equal, round, and reactive to light.  Cardiovascular:     Rate and Rhythm: Normal rate and regular rhythm.     Pulses: Normal pulses.     Heart sounds: Normal heart sounds.  Pulmonary:     Effort: Pulmonary effort is normal.     Breath sounds: Normal breath sounds.  Abdominal:     General: Abdomen is flat. Bowel sounds are normal.     Palpations: Abdomen is soft.  Musculoskeletal:        General: Normal range of motion.     Cervical back: Normal range of motion and neck supple.  Skin:    General: Skin is warm and dry.     Comments: Keloid L auricle  Neurological:     General: No focal deficit present.     Mental Status: She is alert and oriented to person, place, and time.  Psychiatric:        Mood and Affect: Mood normal.        Behavior: Behavior normal.        Thought Content: Thought content normal.        Judgment: Judgment normal.        Yemariam Magar M Ladarien Beeks, PA-C

## 2023-06-19 ENCOUNTER — Encounter: Payer: Self-pay | Admitting: Physician Assistant

## 2023-07-10 ENCOUNTER — Other Ambulatory Visit: Payer: Self-pay | Admitting: Physician Assistant

## 2023-07-10 DIAGNOSIS — M25511 Pain in right shoulder: Secondary | ICD-10-CM

## 2023-07-11 MED ORDER — BACLOFEN 10 MG PO TABS: 10.0000 mg | ORAL_TABLET | Freq: Two times a day (BID) | ORAL | 0 refills | Status: AC

## 2023-07-14 ENCOUNTER — Encounter: Payer: BC Managed Care – PPO | Admitting: Physician Assistant

## 2023-08-04 ENCOUNTER — Encounter: Payer: Self-pay | Admitting: Gastroenterology

## 2023-08-05 ENCOUNTER — Other Ambulatory Visit: Payer: Self-pay | Admitting: Physician Assistant

## 2023-09-21 DIAGNOSIS — R0681 Apnea, not elsewhere classified: Secondary | ICD-10-CM | POA: Diagnosis not present

## 2023-09-22 DIAGNOSIS — R0681 Apnea, not elsewhere classified: Secondary | ICD-10-CM | POA: Diagnosis not present

## 2023-10-14 DIAGNOSIS — G4733 Obstructive sleep apnea (adult) (pediatric): Secondary | ICD-10-CM | POA: Diagnosis not present

## 2023-11-14 DIAGNOSIS — G4733 Obstructive sleep apnea (adult) (pediatric): Secondary | ICD-10-CM | POA: Diagnosis not present

## 2023-11-24 DIAGNOSIS — G4733 Obstructive sleep apnea (adult) (pediatric): Secondary | ICD-10-CM | POA: Diagnosis not present

## 2023-12-14 DIAGNOSIS — G4733 Obstructive sleep apnea (adult) (pediatric): Secondary | ICD-10-CM | POA: Diagnosis not present

## 2023-12-20 DIAGNOSIS — G4733 Obstructive sleep apnea (adult) (pediatric): Secondary | ICD-10-CM | POA: Diagnosis not present

## 2023-12-20 DIAGNOSIS — R400 Somnolence: Secondary | ICD-10-CM | POA: Diagnosis not present

## 2024-01-14 DIAGNOSIS — G4733 Obstructive sleep apnea (adult) (pediatric): Secondary | ICD-10-CM | POA: Diagnosis not present

## 2024-02-14 DIAGNOSIS — G4733 Obstructive sleep apnea (adult) (pediatric): Secondary | ICD-10-CM | POA: Diagnosis not present

## 2024-03-04 DIAGNOSIS — L02416 Cutaneous abscess of left lower limb: Secondary | ICD-10-CM | POA: Diagnosis not present

## 2024-03-04 DIAGNOSIS — L732 Hidradenitis suppurativa: Secondary | ICD-10-CM | POA: Diagnosis not present

## 2024-03-30 ENCOUNTER — Other Ambulatory Visit: Payer: Self-pay | Admitting: Physician Assistant

## 2024-03-30 DIAGNOSIS — R41840 Attention and concentration deficit: Secondary | ICD-10-CM

## 2024-03-30 DIAGNOSIS — F32A Depression, unspecified: Secondary | ICD-10-CM

## 2024-04-13 DIAGNOSIS — G4733 Obstructive sleep apnea (adult) (pediatric): Secondary | ICD-10-CM | POA: Diagnosis not present

## 2024-04-13 DIAGNOSIS — R400 Somnolence: Secondary | ICD-10-CM | POA: Diagnosis not present

## 2024-04-15 DIAGNOSIS — G4733 Obstructive sleep apnea (adult) (pediatric): Secondary | ICD-10-CM | POA: Diagnosis not present

## 2024-05-23 ENCOUNTER — Other Ambulatory Visit: Payer: Self-pay | Admitting: Physician Assistant

## 2024-06-14 ENCOUNTER — Ambulatory Visit (INDEPENDENT_AMBULATORY_CARE_PROVIDER_SITE_OTHER): Admitting: Physician Assistant

## 2024-06-14 ENCOUNTER — Encounter: Payer: Self-pay | Admitting: Physician Assistant

## 2024-06-14 ENCOUNTER — Other Ambulatory Visit (HOSPITAL_COMMUNITY)
Admission: RE | Admit: 2024-06-14 | Discharge: 2024-06-14 | Disposition: A | Source: Ambulatory Visit | Attending: Physician Assistant | Admitting: Physician Assistant

## 2024-06-14 VITALS — BP 122/82 | HR 70 | Temp 97.0°F | Ht 66.54 in | Wt 238.4 lb

## 2024-06-14 DIAGNOSIS — Z0000 Encounter for general adult medical examination without abnormal findings: Secondary | ICD-10-CM | POA: Diagnosis present

## 2024-06-14 DIAGNOSIS — Z0001 Encounter for general adult medical examination with abnormal findings: Secondary | ICD-10-CM | POA: Diagnosis not present

## 2024-06-14 DIAGNOSIS — Z124 Encounter for screening for malignant neoplasm of cervix: Secondary | ICD-10-CM

## 2024-06-14 DIAGNOSIS — Z131 Encounter for screening for diabetes mellitus: Secondary | ICD-10-CM

## 2024-06-14 DIAGNOSIS — Z13220 Encounter for screening for lipoid disorders: Secondary | ICD-10-CM | POA: Diagnosis not present

## 2024-06-14 DIAGNOSIS — E559 Vitamin D deficiency, unspecified: Secondary | ICD-10-CM | POA: Diagnosis not present

## 2024-06-14 DIAGNOSIS — E538 Deficiency of other specified B group vitamins: Secondary | ICD-10-CM

## 2024-06-14 DIAGNOSIS — L910 Hypertrophic scar: Secondary | ICD-10-CM | POA: Insufficient documentation

## 2024-06-14 DIAGNOSIS — F909 Attention-deficit hyperactivity disorder, unspecified type: Secondary | ICD-10-CM

## 2024-06-14 LAB — COMPREHENSIVE METABOLIC PANEL WITH GFR
ALT: 14 U/L (ref 3–35)
AST: 19 U/L (ref 5–37)
Albumin: 3.7 g/dL (ref 3.5–5.2)
Alkaline Phosphatase: 71 U/L (ref 39–117)
BUN: 6 mg/dL (ref 6–23)
CO2: 27 meq/L (ref 19–32)
Calcium: 8.7 mg/dL (ref 8.4–10.5)
Chloride: 104 meq/L (ref 96–112)
Creatinine, Ser: 0.71 mg/dL (ref 0.40–1.20)
GFR: 114.95 mL/min
Glucose, Bld: 77 mg/dL (ref 70–99)
Potassium: 3.6 meq/L (ref 3.5–5.1)
Sodium: 138 meq/L (ref 135–145)
Total Bilirubin: 0.2 mg/dL (ref 0.2–1.2)
Total Protein: 7.5 g/dL (ref 6.0–8.3)

## 2024-06-14 LAB — VITAMIN D 25 HYDROXY (VIT D DEFICIENCY, FRACTURES): VITD: 10.63 ng/mL — ABNORMAL LOW (ref 30.00–100.00)

## 2024-06-14 LAB — CBC WITH DIFFERENTIAL/PLATELET
Basophils Absolute: 0 10*3/uL (ref 0.0–0.1)
Basophils Relative: 0.4 % (ref 0.0–3.0)
Eosinophils Absolute: 0.2 10*3/uL (ref 0.0–0.7)
Eosinophils Relative: 3.8 % (ref 0.0–5.0)
HCT: 34.1 % — ABNORMAL LOW (ref 36.0–46.0)
Hemoglobin: 11 g/dL — ABNORMAL LOW (ref 12.0–15.0)
Lymphocytes Relative: 41.5 % (ref 12.0–46.0)
Lymphs Abs: 2.2 10*3/uL (ref 0.7–4.0)
MCHC: 32.3 g/dL (ref 30.0–36.0)
MCV: 78.2 fl (ref 78.0–100.0)
Monocytes Absolute: 0.6 10*3/uL (ref 0.1–1.0)
Monocytes Relative: 10.2 % (ref 3.0–12.0)
Neutro Abs: 2.4 10*3/uL (ref 1.4–7.7)
Neutrophils Relative %: 44.1 % (ref 43.0–77.0)
Platelets: 337 10*3/uL (ref 150.0–400.0)
RBC: 4.36 Mil/uL (ref 3.87–5.11)
RDW: 17.6 % — ABNORMAL HIGH (ref 11.5–15.5)
WBC: 5.4 10*3/uL (ref 4.0–10.5)

## 2024-06-14 LAB — LIPID PANEL
Cholesterol: 126 mg/dL (ref 28–200)
HDL: 45.1 mg/dL
LDL Cholesterol: 62 mg/dL (ref 10–99)
NonHDL: 80.94
Total CHOL/HDL Ratio: 3
Triglycerides: 95 mg/dL (ref 10.0–149.0)
VLDL: 19 mg/dL (ref 0.0–40.0)

## 2024-06-14 LAB — VITAMIN B12: Vitamin B-12: 375 pg/mL (ref 211–911)

## 2024-06-14 LAB — TSH: TSH: 0.81 u[IU]/mL (ref 0.35–5.50)

## 2024-06-14 LAB — HEMOGLOBIN A1C: Hgb A1c MFr Bld: 5.8 % (ref 4.6–6.5)

## 2024-06-14 MED ORDER — FLUCONAZOLE 150 MG PO TABS: ORAL_TABLET | ORAL | 0 refills | Status: AC

## 2024-06-14 NOTE — Progress Notes (Signed)
 "   Patient ID: Sandy Ryan, female    DOB: [birth year], 29 y.o.   MRN: [id number]   Assessment & Plan:  Annual physical exam -     CBC with Differential/Platelet -     Comprehensive metabolic panel with GFR -     Hemoglobin A1c -     Lipid panel -     TSH -     Vitamin B12 -     VITAMIN D  25 Hydroxy (Vit-D Deficiency, Fractures) -     Cytology - PAP  B12 deficiency -     Vitamin B12  Vitamin D  deficiency -     VITAMIN D  25 Hydroxy (Vit-D Deficiency, Fractures)  Screening for cervical cancer -     Cytology - PAP  Attention deficit hyperactivity disorder (ADHD), unspecified ADHD type  Other orders -     Fluconazole ; Take 1 tab po now and then take 2nd tab po in 72 hours.  Dispense: 2 tablet; Refill: 0     Assessment & Plan Woman's Wellness Visit Routine well woman exam with no breast concerns, family history of breast cancer, or abnormal Pap smears. Regular menstrual cycles with occasional timing changes. No current birth control use and no desire to start. No STI concerns. Normal bowel and urinary habits. - Performed Pap smear - Conducted breast exam - Ordered blood work for cholesterol, diabetes, and thyroid screening  Vulvovaginal candidiasis Suspected vulvovaginal candidiasis with symptoms of itching and discharge. - Prescribed Diflucan  for treatment   Age-appropriate screening and counseling performed today. Will check labs and call with results. Preventive measures discussed and printed in AVS for patient.   See AVS:  Patient Counseling: [x]   Nutrition: Stressed importance of moderation in sodium/caffeine intake, saturated fat and cholesterol, caloric balance, sufficient intake of fresh fruits, vegetables, and fiber.  [x]   Stressed the importance of regular exercise.   []   Substance Abuse: Discussed cessation/primary prevention of tobacco, alcohol, or other drug use; driving or other dangerous activities under the influence; availability of treatment for abuse.    []   Injury prevention: Discussed safety belts, safety helmets, smoke detector, smoking near bedding or upholstery.   []   Sexuality: Discussed sexually transmitted diseases, partner selection, use of condoms, avoidance of unintended pregnancy  and contraceptive alternatives.   [x]   Dental health: Discussed importance of regular tooth brushing, flossing, and dental visits.  [x]   Health maintenance and immunizations reviewed. Please refer to Health maintenance section.       Return in about 6 months (around 12/12/2024) for recheck/follow-up med check .    Subjective:    Chief Complaint  Patient presents with   Annual Exam    Pt in office for annual CPE and labs;     HPI Discussed the use of AI scribe software for clinical note transcription with the patient, who gave verbal consent to proceed.  History of Present Illness Sandy Ryan is a 29 year old female who presents for a well woman exam.  Her menstrual cycles are regular, though the exact day may vary every four months. No abnormal vaginal bleeding is reported.  She is sexually active with the same partner and has no concerns about sexually transmitted infections. She is not on birth control and does not wish to start it. She reports some vaginal discharge and itching. No changes in bowel habits or urinary function.  She experiences cysts on her skin, particularly around the opening, which she describes as a 'pop sensation' monthly. She has a  prescription for doxycycline  to use when these cysts flare up. She recalls a significant cyst on her thigh that required urgent care in December, which opened completely after popping.     Past Medical History:  Diagnosis Date   ADHD 05/12/2023   Anemia 2019   Anxiety    Arthritis 2020   Asthma    Depression 2024    Past Surgical History:  Procedure Laterality Date   ELBOW SURGERY     FRACTURE SURGERY     KNEE SURGERY Right 01/30/2015   torn meniscus and ACL   SHOULDER  SURGERY     thumb surgery Left 2010    Family History  Problem Relation Age of Onset   Hypertension Mother    Colon cancer Neg Hx    Esophageal cancer Neg Hx    Stomach cancer Neg Hx     Social History[1]   Allergies[2]  Review of Systems NEGATIVE UNLESS OTHERWISE INDICATED IN HPI      Objective:     BP 122/82 (BP Location: Left Arm, Patient Position: Sitting, Cuff Size: Normal)   Pulse 70   Temp (!) 97 F (36.1 C) (Temporal)   Ht 5' 6.54 (1.69 m)   Wt 238 lb 6.4 oz (108.1 kg)   SpO2 96%   BMI 37.86 kg/m   Wt Readings from Last 3 Encounters:  06/14/24 238 lb 6.4 oz (108.1 kg)  06/14/23 244 lb 6.4 oz (110.9 kg)  05/27/23 240 lb (108.9 kg)    BP Readings from Last 3 Encounters:  06/14/24 122/82  06/14/23 124/78  05/27/23 110/66     Physical Exam Vitals and nursing note reviewed. Exam conducted with a chaperone present.  Constitutional:      Appearance: Normal appearance. She is normal weight. She is not toxic-appearing.  HENT:     Head: Normocephalic and atraumatic.     Right Ear: Tympanic membrane, ear canal and external ear normal.     Left Ear: Tympanic membrane, ear canal and external ear normal.     Nose: Nose normal.     Mouth/Throat:     Mouth: Mucous membranes are moist.  Eyes:     Extraocular Movements: Extraocular movements intact.     Conjunctiva/sclera: Conjunctivae normal.     Pupils: Pupils are equal, round, and reactive to light.  Neck:     Thyroid: No thyroid mass, thyromegaly or thyroid tenderness.  Cardiovascular:     Rate and Rhythm: Normal rate and regular rhythm.     Pulses: Normal pulses.     Heart sounds: Normal heart sounds.  Pulmonary:     Effort: Pulmonary effort is normal.     Breath sounds: Normal breath sounds.  Chest:     Chest wall: No mass.  Breasts:    Right: Normal. No swelling, bleeding, inverted nipple, mass, nipple discharge, skin change or tenderness.     Left: Normal. No swelling, bleeding, inverted  nipple, mass, nipple discharge, skin change or tenderness.  Abdominal:     General: Abdomen is flat. Bowel sounds are normal.     Palpations: Abdomen is soft.  Genitourinary:    General: Normal vulva.     Labia:        Right: No rash, tenderness or lesion.        Left: No rash, tenderness or lesion.      Vagina: Vaginal discharge (white, clumpy) present.     Cervix: Normal.     Uterus: Normal.      Adnexa:  Right adnexa normal and left adnexa normal.  Musculoskeletal:        General: Normal range of motion.     Cervical back: Normal range of motion and neck supple.     Right lower leg: No edema.     Left lower leg: No edema.  Lymphadenopathy:     Cervical: No cervical adenopathy.     Upper Body:     Right upper body: No supraclavicular, axillary or pectoral adenopathy.     Left upper body: No supraclavicular, axillary or pectoral adenopathy.  Skin:    General: Skin is warm and dry.     Findings: No lesion.  Neurological:     General: No focal deficit present.     Mental Status: She is alert and oriented to person, place, and time.  Psychiatric:        Mood and Affect: Mood normal.        Behavior: Behavior normal.        Thought Content: Thought content normal.        Judgment: Judgment normal.             Alycen Mack M Taquila Leys, PA-C     [1]  Social History Tobacco Use   Smoking status: Never   Smokeless tobacco: Never  Vaping Use   Vaping status: Never Used  Substance Use Topics   Alcohol use: Yes    Comment: socially   Drug use: Never  [2]  Allergies Allergen Reactions   Other Rash   Tape Other (See Comments)    Adhesive-Blisters on the skin   Tapentadol Hives    adhesive adhesive    Wound Dressing Adhesive     Adhesive-Causes blisters   "

## 2024-06-20 ENCOUNTER — Ambulatory Visit: Payer: Self-pay | Admitting: Physician Assistant

## 2024-06-20 ENCOUNTER — Encounter: Admitting: Physician Assistant

## 2024-12-13 ENCOUNTER — Ambulatory Visit: Admitting: Physician Assistant

## 2025-06-18 ENCOUNTER — Encounter: Admitting: Physician Assistant
